# Patient Record
Sex: Female | Born: 1970 | Race: Asian | Hispanic: No | Smoking: Never smoker
Health system: Southern US, Community
[De-identification: ages and names within clinical notes are randomized; demographics above are authoritative.]

## PROBLEM LIST (undated history)

## (undated) DIAGNOSIS — G5600 Carpal tunnel syndrome, unspecified upper limb: Secondary | ICD-10-CM

## (undated) DIAGNOSIS — R921 Mammographic calcification found on diagnostic imaging of breast: Secondary | ICD-10-CM

## (undated) DIAGNOSIS — R7989 Other specified abnormal findings of blood chemistry: Secondary | ICD-10-CM

## (undated) DIAGNOSIS — E781 Pure hyperglyceridemia: Secondary | ICD-10-CM

## (undated) DIAGNOSIS — K219 Gastro-esophageal reflux disease without esophagitis: Secondary | ICD-10-CM

## (undated) DIAGNOSIS — D219 Benign neoplasm of connective and other soft tissue, unspecified: Secondary | ICD-10-CM

## (undated) HISTORY — DX: Carpal tunnel syndrome, unspecified upper limb: G56.00

## (undated) HISTORY — DX: Mammographic calcification found on diagnostic imaging of breast: R92.1

## (undated) HISTORY — DX: Other specified abnormal findings of blood chemistry: R79.89

## (undated) HISTORY — DX: Gastro-esophageal reflux disease without esophagitis: K21.9

## (undated) HISTORY — DX: Pure hyperglyceridemia: E78.1

## (undated) HISTORY — DX: Benign neoplasm of connective and other soft tissue, unspecified: D21.9

---

## 2004-08-07 ENCOUNTER — Ambulatory Visit: Payer: Self-pay | Admitting: Internal Medicine

## 2006-04-23 ENCOUNTER — Ambulatory Visit: Payer: Self-pay | Admitting: Endocrinology

## 2006-04-24 ENCOUNTER — Encounter (INDEPENDENT_AMBULATORY_CARE_PROVIDER_SITE_OTHER): Payer: Self-pay | Admitting: *Deleted

## 2006-04-24 ENCOUNTER — Ambulatory Visit (HOSPITAL_COMMUNITY): Admission: RE | Admit: 2006-04-24 | Discharge: 2006-04-24 | Payer: Self-pay | Admitting: Endocrinology

## 2007-02-12 ENCOUNTER — Ambulatory Visit: Payer: Self-pay | Admitting: Internal Medicine

## 2007-02-12 DIAGNOSIS — F411 Generalized anxiety disorder: Secondary | ICD-10-CM | POA: Insufficient documentation

## 2007-02-12 DIAGNOSIS — M545 Low back pain, unspecified: Secondary | ICD-10-CM | POA: Insufficient documentation

## 2007-02-12 DIAGNOSIS — F329 Major depressive disorder, single episode, unspecified: Secondary | ICD-10-CM | POA: Insufficient documentation

## 2007-02-12 LAB — CONVERTED CEMR LAB: H Pylori IgG: NEGATIVE

## 2007-07-15 ENCOUNTER — Ambulatory Visit: Payer: Self-pay | Admitting: Internal Medicine

## 2007-07-15 LAB — CONVERTED CEMR LAB
ALT: 12 units/L (ref 0–35)
AST: 21 units/L (ref 0–37)
Alkaline Phosphatase: 42 units/L (ref 39–117)
Basophils Relative: 0.6 % (ref 0.0–1.0)
Bilirubin, Direct: 0.1 mg/dL (ref 0.0–0.3)
CO2: 30 meq/L (ref 19–32)
Calcium: 9 mg/dL (ref 8.4–10.5)
Chloride: 101 meq/L (ref 96–112)
Creatinine, Ser: 0.6 mg/dL (ref 0.4–1.2)
Crystals: NEGATIVE
Eosinophils Relative: 0.5 % (ref 0.0–5.0)
Glucose, Bld: 95 mg/dL (ref 70–99)
Leukocytes, UA: NEGATIVE
Lymphocytes Relative: 27.3 % (ref 12.0–46.0)
Neutro Abs: 5.4 10*3/uL (ref 1.4–7.7)
Nitrite: NEGATIVE
Platelets: 309 10*3/uL (ref 150–400)
RDW: 11.4 % — ABNORMAL LOW (ref 11.5–14.6)
Specific Gravity, Urine: 1.025 (ref 1.000–1.03)
Total CHOL/HDL Ratio: 3.2
Total Protein, Urine: NEGATIVE mg/dL
Total Protein: 7.5 g/dL (ref 6.0–8.3)
Urine Glucose: NEGATIVE mg/dL
WBC: 8.3 10*3/uL (ref 4.5–10.5)

## 2007-07-19 ENCOUNTER — Ambulatory Visit: Payer: Self-pay | Admitting: Internal Medicine

## 2007-07-19 DIAGNOSIS — J069 Acute upper respiratory infection, unspecified: Secondary | ICD-10-CM | POA: Insufficient documentation

## 2007-07-19 DIAGNOSIS — K130 Diseases of lips: Secondary | ICD-10-CM | POA: Insufficient documentation

## 2008-07-17 LAB — CONVERTED CEMR LAB: Pap Smear: NORMAL

## 2008-08-04 ENCOUNTER — Ambulatory Visit: Payer: Self-pay | Admitting: Internal Medicine

## 2008-08-04 LAB — CONVERTED CEMR LAB
ALT: 15 units/L (ref 0–35)
AST: 26 units/L (ref 0–37)
Alkaline Phosphatase: 42 units/L (ref 39–117)
Basophils Absolute: 0 10*3/uL (ref 0.0–0.1)
Bilirubin Urine: NEGATIVE
Bilirubin, Direct: 0.1 mg/dL (ref 0.0–0.3)
CO2: 31 meq/L (ref 19–32)
Chloride: 104 meq/L (ref 96–112)
Glucose, Bld: 100 mg/dL — ABNORMAL HIGH (ref 70–99)
Hemoglobin: 15.6 g/dL — ABNORMAL HIGH (ref 12.0–15.0)
LDL Cholesterol: 73 mg/dL (ref 0–99)
Leukocytes, UA: NEGATIVE
Lymphocytes Relative: 30.9 % (ref 12.0–46.0)
MCHC: 34.9 g/dL (ref 30.0–36.0)
Monocytes Relative: 8.8 % (ref 3.0–12.0)
Neutro Abs: 4 10*3/uL (ref 1.4–7.7)
Neutrophils Relative %: 59.2 % (ref 43.0–77.0)
Nitrite: NEGATIVE
Potassium: 4.1 meq/L (ref 3.5–5.1)
RBC: 4.86 M/uL (ref 3.87–5.11)
RDW: 11.4 % — ABNORMAL LOW (ref 11.5–14.6)
Sodium: 138 meq/L (ref 135–145)
Specific Gravity, Urine: 1.01 (ref 1.000–1.03)
Total Bilirubin: 0.6 mg/dL (ref 0.3–1.2)
Total CHOL/HDL Ratio: 3.7
Total Protein: 7.6 g/dL (ref 6.0–8.3)
Urobilinogen, UA: 0.2 (ref 0.0–1.0)
pH: 6 (ref 5.0–8.0)

## 2008-08-11 ENCOUNTER — Ambulatory Visit: Payer: Self-pay | Admitting: Internal Medicine

## 2008-08-11 DIAGNOSIS — R0602 Shortness of breath: Secondary | ICD-10-CM | POA: Insufficient documentation

## 2008-08-11 DIAGNOSIS — J45909 Unspecified asthma, uncomplicated: Secondary | ICD-10-CM | POA: Insufficient documentation

## 2008-08-11 IMAGING — CR DG CHEST 2V
4 series · 4 of 4 positions shown · non-contrast
Comparison: [DATE]

CLINICAL DATA: Dyspnea

CHEST - 2 VIEW

[view not recorded (1 of 4)]
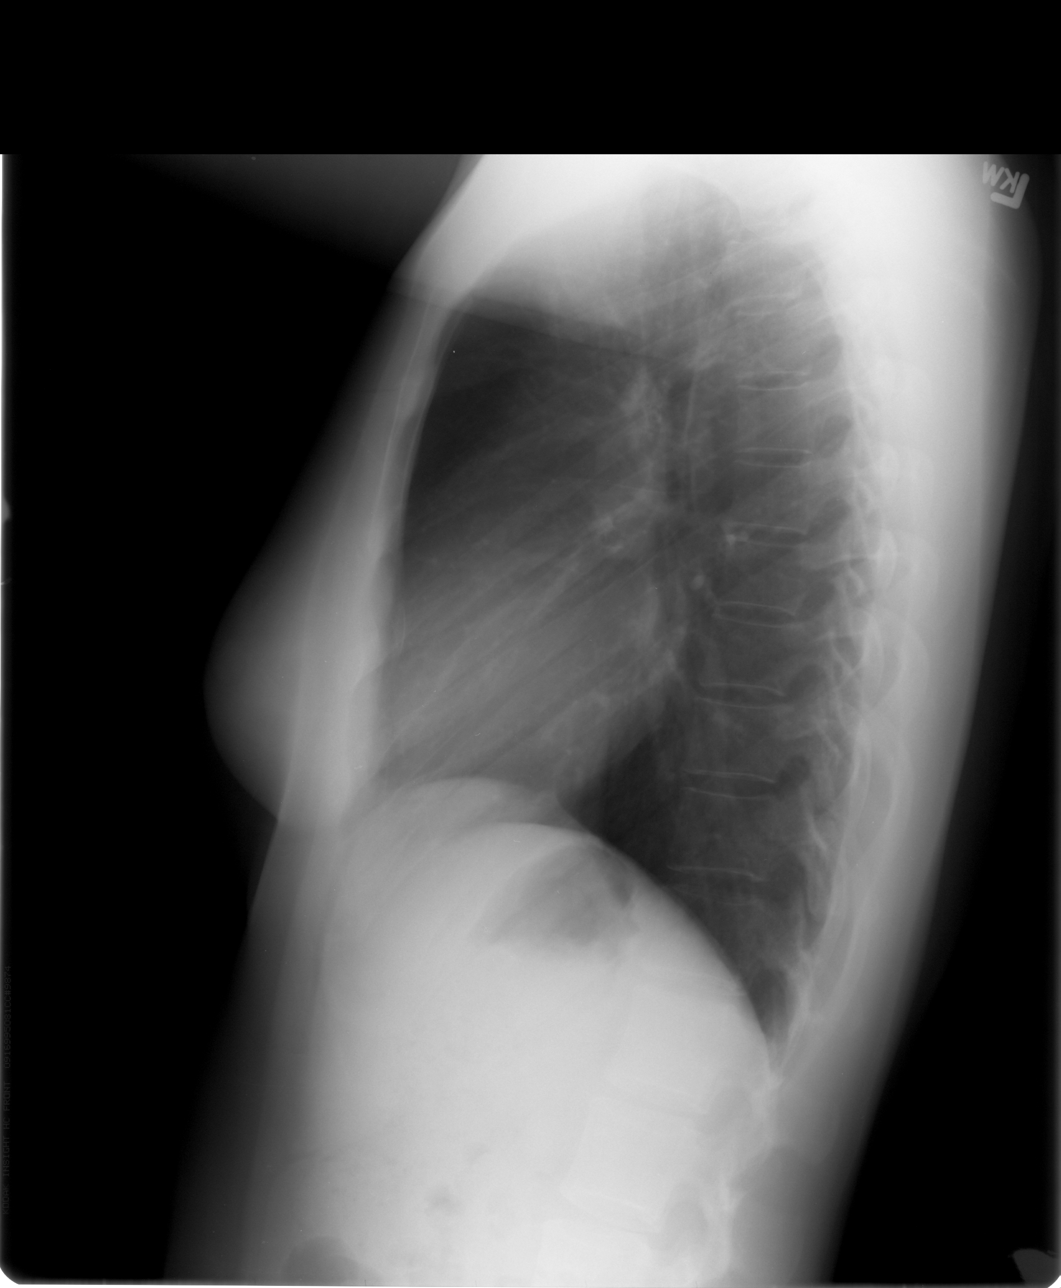

[view not recorded (2 of 4)]
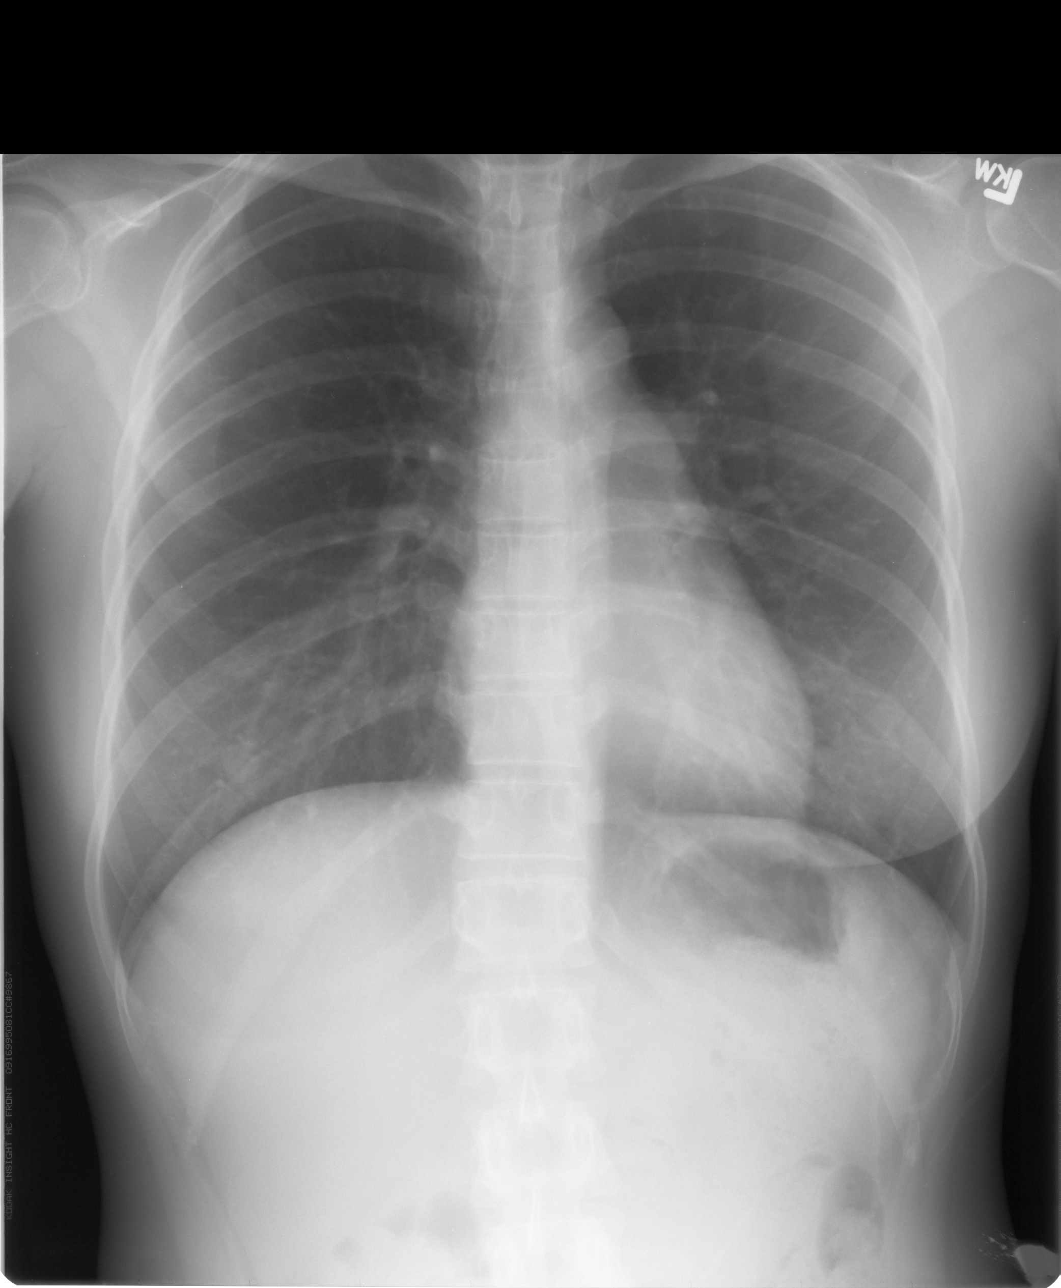

[view not recorded (3 of 4)]
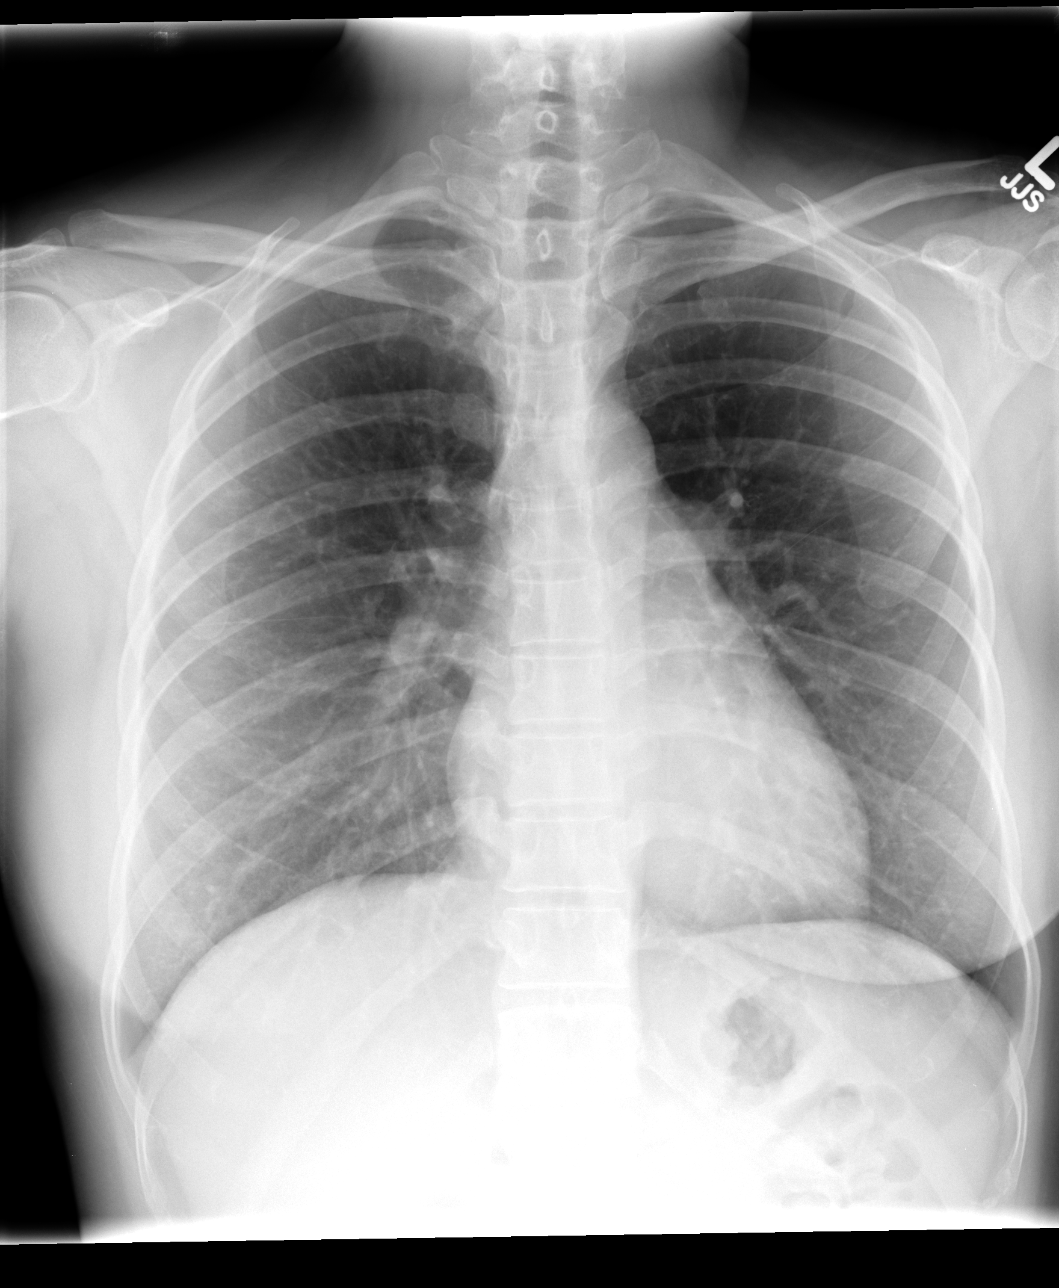

[view not recorded (4 of 4)]
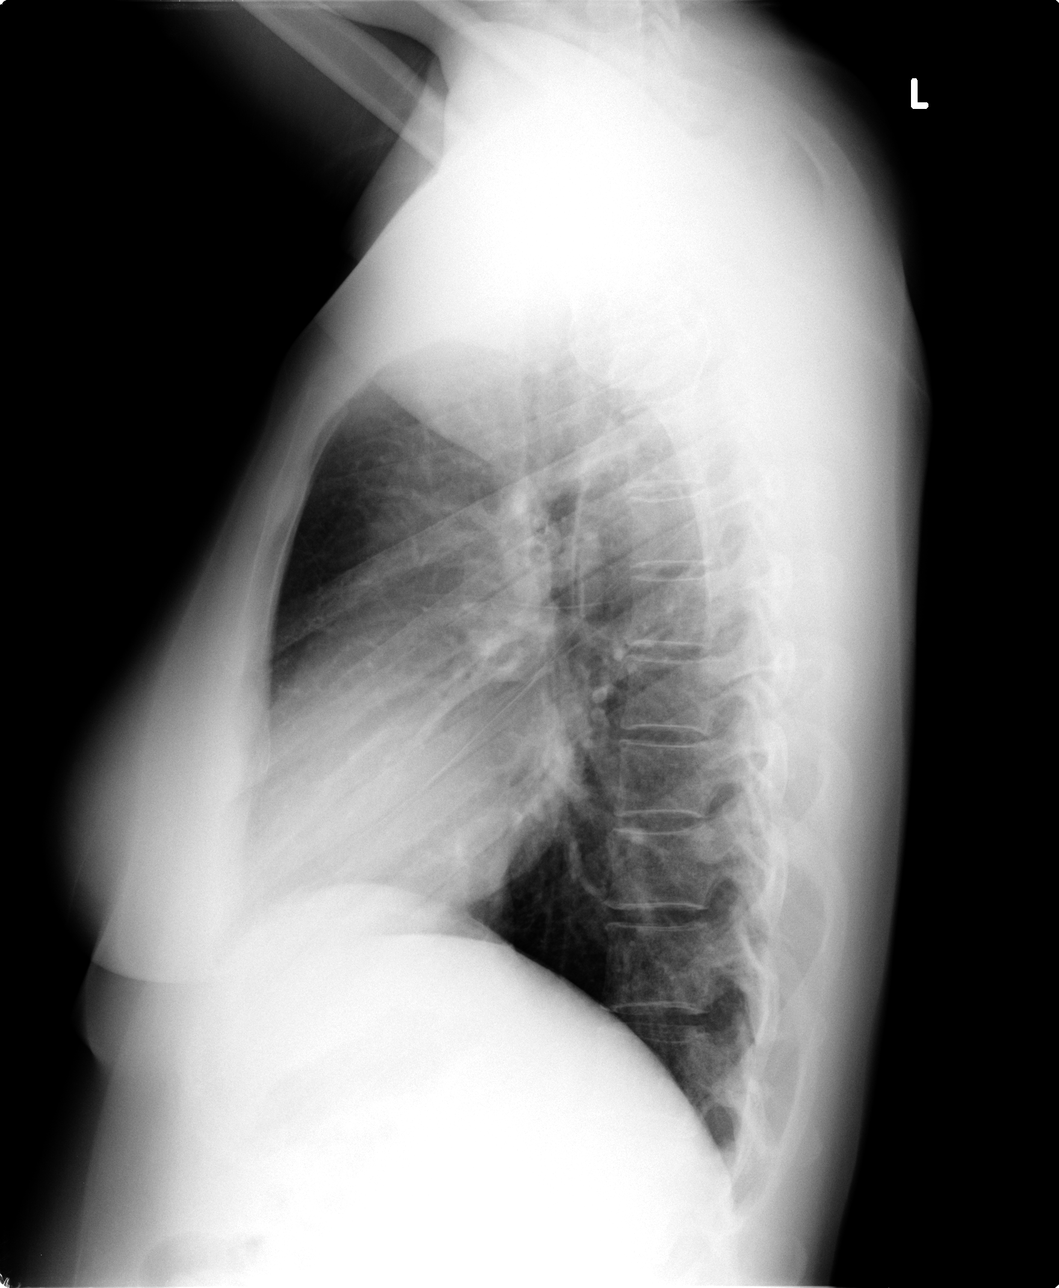

[4 of 4 positions shown; findings below may reference images not displayed]

FINDINGS: The heart size and mediastinal contours are within normal
limits.  Both lungs are clear.  The visualized skeletal structures
are unremarkable.
IMPRESSION: No active cardiopulmonary disease.

## 2009-01-29 ENCOUNTER — Telehealth: Payer: Self-pay | Admitting: Internal Medicine

## 2009-01-29 DIAGNOSIS — K219 Gastro-esophageal reflux disease without esophagitis: Secondary | ICD-10-CM | POA: Insufficient documentation

## 2009-03-06 ENCOUNTER — Ambulatory Visit: Payer: Self-pay | Admitting: *Deleted

## 2009-03-06 DIAGNOSIS — E739 Lactose intolerance, unspecified: Secondary | ICD-10-CM | POA: Insufficient documentation

## 2009-03-20 ENCOUNTER — Encounter: Payer: Self-pay | Admitting: Gastroenterology

## 2009-03-20 ENCOUNTER — Encounter (INDEPENDENT_AMBULATORY_CARE_PROVIDER_SITE_OTHER): Payer: Self-pay

## 2009-03-20 ENCOUNTER — Ambulatory Visit: Payer: Self-pay | Admitting: Gastroenterology

## 2009-03-21 ENCOUNTER — Encounter: Payer: Self-pay | Admitting: Gastroenterology

## 2009-08-09 ENCOUNTER — Ambulatory Visit: Payer: Self-pay | Admitting: Internal Medicine

## 2009-08-09 DIAGNOSIS — M25569 Pain in unspecified knee: Secondary | ICD-10-CM | POA: Insufficient documentation

## 2009-08-09 DIAGNOSIS — J019 Acute sinusitis, unspecified: Secondary | ICD-10-CM | POA: Insufficient documentation

## 2009-08-09 DIAGNOSIS — M549 Dorsalgia, unspecified: Secondary | ICD-10-CM | POA: Insufficient documentation

## 2009-08-09 DIAGNOSIS — G4700 Insomnia, unspecified: Secondary | ICD-10-CM | POA: Insufficient documentation

## 2009-08-22 ENCOUNTER — Telehealth: Payer: Self-pay | Admitting: Internal Medicine

## 2009-08-23 ENCOUNTER — Telehealth: Payer: Self-pay | Admitting: Internal Medicine

## 2010-07-23 NOTE — Progress Notes (Signed)
Summary: ABX?  Phone Note Call from Patient Call back at Home Phone [phone]   Caller: Patient Summary of Call: pt called stating that she has finished her course of ABX but is still sick, c/o HA and ear pain. pt is requesting another course. please advise Initial call taken by: Margaret Pyle, CMA,  August 22, 2009 2:23 PM  Follow-up for Phone Call        pt informed Follow-up by: Margaret Pyle, CMA,  August 22, 2009 2:38 PM    New/Updated Medications: AZITHROMYCIN 250 MG TABS (AZITHROMYCIN) 2po qd for 1 day, then 1po qd for 4days, then stop Prescriptions: AZITHROMYCIN 250 MG TABS (AZITHROMYCIN) 2po qd for 1 day, then 1po qd for 4days, then stop  #6 x 1   Entered and Authorized by:   Corwin Levins MD   Signed by:   Corwin Levins MD on 08/22/2009   Method used:   Electronically to        [facility] [phone]* (retail)       [address]       [place], [place]  [address]       Ph: [phone] or [phone]       Fax: [phone]   RxID:   [id number]  done escript Corwin Levins MD  August 22, 2009 2:34 PM

## 2010-07-23 NOTE — Progress Notes (Signed)
Summary: pt?  Phone Note Call from Patient Call back at Home Phone [phone]   Caller: Patient Summary of Call: pt called stating that she is having chills, night sweats and SOB. pt wants to know if she should continue taking ABX or return for OV? Initial call taken by: Margaret Pyle, CMA,  August 23, 2009 1:04 PM  Follow-up for Phone Call        as per yesterday, ok for change antibx - done escript yest;  can make OV if she likes but I am not sure is necessary unless high fever, increased pain, wheezing or sob Follow-up by: Corwin Levins MD,  August 23, 2009 1:21 PM  Additional Follow-up for Phone Call Additional follow up Details #1::        pt informed and will continue taking ABX Additional Follow-up by: Margaret Pyle, CMA,  August 23, 2009 2:02 PM

## 2010-07-23 NOTE — Assessment & Plan Note (Signed)
Summary: cold,cough     Vital Signs:  Patient profile:   40 year old female Height:      65 inches Weight:      148 pounds BMI:     24.72 O2 Sat:      98 % on Room air Temp:     98.1 degrees F oral Pulse rate:   78 / minute BP sitting:   102 / 70  (left arm) Cuff size:   regular  Vitals Entered ByZella Ball Ewing (August 09, 2009 3:28 PM)  O2 Flow:  Room air CC: cold, headaches/RE   Primary Care Provider:  Oliver Barre ,MD   CC:  cold and headaches/RE.  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  also has had some diffuse upper bilat back discomfort non pleuritic and worse to move the arms and shoulders but again no symptoms as above.  Pain and worry and illness and stress recently causing marked problem with insomnia over the past wk.  denies signficant depressive symtpoms, and no suicidal ideation or panic.    Problems Prior to Update: 1)  Insomnia-sleep Disorder-unspec  (ICD-780.52) 2)  Sinusitis- Acute-nos  (ICD-461.9) 3)  Lactose Intolerance  (ICD-271.3) 4)  Gerd  (ICD-530.81) 5)  Asthma  (ICD-493.90) 6)  Dyspnea  (ICD-786.05) 7)  Preventive Health Care  (ICD-V70.0) 8)  Cheilitis, Angular  (ICD-528.5) 9)  Uri  (ICD-465.9) 10)  Preventive Health Care  (ICD-V70.0) 11)  Routine General Medical Exam@health  Care Facl  (ICD-V70.0) 12)  Low Back Pain  (ICD-724.2) 13)   Depression  (ICD-311) 14)  Anxiety  (ICD-300.00) 15)  Positive Ppd  (ICD-795.5)  Medications Prior to Update: 1)  Proair Hfa 108 (90 Base) Mcg/act Aers (Albuterol Sulfate) .... 2 Puffs Four Times Per Day As Needed For Shortness of Breath 2)  Prenatal Vitamins (Unknown Dosage) .... Take 1 Tablet By Mouth Once A Day 3)  Dexilant 60 Mg Cpdr (Dexlansoprazole) .Marland Kitchen.. 1 Capsule By Mouth Every Morning 4)  Diflucan 100 Mg  Tabs (Fluconazole) .... Take 1 Tablet Once A Day 5)  Pylera 140-125-125 Mg Caps (Bis Subcit-Metronid-Tetracyc) .... 3 By Mouth Qid X 10 Days  Current Medications (verified): 1)  Proair Hfa 108 (90 Base) Mcg/act Aers (Albuterol Sulfate) .... 2 Puffs Four Times Per Day As Needed For Shortness of Breath 2)  Prenatal Vitamins (Unknown Dosage) .... Take 1 Tablet By Mouth Once A Day 3)  Dexilant 60 Mg Cpdr (Dexlansoprazole) .Marland Kitchen.. 1 Capsule By Mouth Every Morning 4)  Diflucan 100 Mg  Tabs (Fluconazole) .... Take 1 Tablet Once A Day 5)  Pylera 140-125-125 Mg Caps (Bis Subcit-Metronid-Tetracyc) .... 3 By Mouth Qid X 10 Days 6)  Cephalexin 500 Mg Caps (Cephalexin) .Marland Kitchen.. 1 By Mouth Three Times A Day 7)  Zolpidem Tartrate 10 Mg Tabs (Zolpidem Tartrate) .Marland Kitchen.. 1po At Bedtime As Needed  Allergies (verified): 1)  ! 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   Head:  normocephalic and atraumatic.   Eyes:  vision grossly intact, pupils equal, and pupils round.   Ears:  bilat  tm's mild red, sinus tedner bilat left > right Nose:  nasal dischargemucosal pallor and mucosal edema.   Mouth:  pharyngeal erythema and fair dentition.   Neck:  supple and cervical lymphadenopathy.   Lungs:  normal respiratory effort and normal breath sounds.   Heart:  normal rate and regular rhythm.   Abdomen:  soft, non-tender, and normal bowel sounds.   Msk:  has some mild left lateral lumbar tender but minor spasm,  left knee FROM without tender and no swelling;  no upper back tender, no spine tender Extremities:  no edema, no erythema  Neurologic:  cranial nerves II-XII intact, strength normal in all extremities, and sensation intact to light touch.   Psych:  not depressed appearing and moderately anxious.     Impression & Recommendations:  Problem # 1:  SINUSITIS- ACUTE-NOS (ICD-461.9)  Her updated medication list for this problem includes:    Cephalexin 500 Mg Caps (Cephalexin) .Marland Kitchen... 1 by mouth three times a day treat as above, f/u any worsening signs or symptoms   Problem # 2:  INSOMNIA-SLEEP DISORDER-UNSPEC (ICD-780.52)  Her updated medication list for this problem includes:    Zolpidem Tartrate 10 Mg Tabs (Zolpidem tartrate) .Marland Kitchen... 1po at bedtime as needed treat as above, f/u any worsening signs or symptoms   Problem # 3:  BACK PAIN (ICD-724.5) c/w msk strain, for tylenol as needed   Problem # 4:  KNEE PAIN, LEFT (ICD-719.46) bening exam, reassured, ok to follow, for tylenol as needed   Complete Medication List: 1)  Proair Hfa 108 (90 Base) Mcg/act Aers (Albuterol sulfate) .... 2 puffs four times per day as needed for shortness of breath 2)  Prenatal Vitamins (unknown Dosage)  .... Take 1 tablet by mouth once a day 3)  Dexilant 60 Mg Cpdr (Dexlansoprazole) .Marland Kitchen.. 1 capsule by mouth every morning 4)  Diflucan 100 Mg Tabs (Fluconazole) .... Take 1 tablet once a day 5)  Pylera 140-125-125 Mg Caps (Bis subcit-metronid-tetracyc) .... 3 by mouth qid x 10 days 6)  Cephalexin  500 Mg Caps (Cephalexin) .Marland Kitchen.. 1 by mouth three times a day 7)  Zolpidem Tartrate 10 Mg Tabs (Zolpidem tartrate) .Marland Kitchen.. 1po at bedtime as needed  Patient Instructions: 1)  Please take all new medications as prescribed  - the antibiotic, and the sleep medicine 2)  Continue all previous medications as before this visit  3)  Please schedule a follow-up appointment as needed. Prescriptions: ZOLPIDEM TARTRATE 10 MG TABS (ZOLPIDEM TARTRATE) 1po at bedtime as needed  #30 x 2   Entered and Authorized by:   Corwin Levins MD   Signed by:   Corwin Levins MD on 08/09/2009   Method used:   Print then Give to Patient   RxID:  [id number] CEPHALEXIN 500 MG CAPS (CEPHALEXIN) 1 by mouth three times a day  #30 x 0   Entered and Authorized by:   Corwin Levins MD   Signed by:   Corwin Levins MD on 08/09/2009   Method used:   Print then Give to Patient   RxID:   [id number]

## 2010-09-27 ENCOUNTER — Other Ambulatory Visit (HOSPITAL_COMMUNITY): Payer: Self-pay | Admitting: Internal Medicine

## 2010-09-27 DIAGNOSIS — Z1231 Encounter for screening mammogram for malignant neoplasm of breast: Secondary | ICD-10-CM

## 2010-10-04 ENCOUNTER — Ambulatory Visit (HOSPITAL_COMMUNITY)
Admission: RE | Admit: 2010-10-04 | Discharge: 2010-10-04 | Disposition: A | Discharge status: Home / Self Care | Payer: Self-pay | Source: Ambulatory Visit | Attending: Internal Medicine | Admitting: Internal Medicine

## 2010-10-04 DIAGNOSIS — Z1231 Encounter for screening mammogram for malignant neoplasm of breast: Secondary | ICD-10-CM

## 2011-10-29 ENCOUNTER — Other Ambulatory Visit (HOSPITAL_COMMUNITY): Payer: Self-pay | Admitting: Internal Medicine

## 2011-10-29 DIAGNOSIS — Z1231 Encounter for screening mammogram for malignant neoplasm of breast: Secondary | ICD-10-CM

## 2011-11-25 ENCOUNTER — Ambulatory Visit (HOSPITAL_COMMUNITY)
Admission: RE | Admit: 2011-11-25 | Discharge: 2011-11-25 | Disposition: A | Discharge status: Home / Self Care | Payer: Self-pay | Source: Ambulatory Visit | Attending: Internal Medicine | Admitting: Internal Medicine

## 2011-11-25 DIAGNOSIS — Z1231 Encounter for screening mammogram for malignant neoplasm of breast: Secondary | ICD-10-CM

## 2012-11-12 ENCOUNTER — Other Ambulatory Visit (HOSPITAL_COMMUNITY): Payer: Self-pay | Admitting: Internal Medicine

## 2012-11-12 DIAGNOSIS — Z1231 Encounter for screening mammogram for malignant neoplasm of breast: Secondary | ICD-10-CM

## 2012-11-29 ENCOUNTER — Ambulatory Visit (HOSPITAL_COMMUNITY): Payer: Self-pay

## 2012-11-30 ENCOUNTER — Ambulatory Visit (INDEPENDENT_AMBULATORY_CARE_PROVIDER_SITE_OTHER): Payer: BC Managed Care – PPO | Admitting: Physician Assistant

## 2012-11-30 VITALS — BP 102/56 | HR 83 | Temp 98.1°F | Resp 14 | Ht 66.0 in | Wt 146.0 lb

## 2012-11-30 DIAGNOSIS — Z23 Encounter for immunization: Secondary | ICD-10-CM

## 2012-11-30 DIAGNOSIS — K219 Gastro-esophageal reflux disease without esophagitis: Secondary | ICD-10-CM

## 2012-11-30 DIAGNOSIS — Z0000 Encounter for general adult medical examination without abnormal findings: Secondary | ICD-10-CM

## 2012-11-30 LAB — COMPREHENSIVE METABOLIC PANEL
AST: 18 U/L (ref 0–37)
Alkaline Phosphatase: 46 U/L (ref 39–117)
BUN: 11 mg/dL (ref 6–23)
Glucose, Bld: 91 mg/dL (ref 70–99)
Sodium: 138 mEq/L (ref 135–145)
Total Bilirubin: 0.6 mg/dL (ref 0.3–1.2)

## 2012-11-30 LAB — LIPID PANEL
HDL: 43 mg/dL (ref >39)
LDL Cholesterol: 69 mg/dL (ref 0–99)
Triglycerides: 104 mg/dL (ref <150)
VLDL: 21 mg/dL (ref 0–40)

## 2012-11-30 LAB — POCT URINALYSIS DIPSTICK
Nitrite, UA: NEGATIVE
Protein, UA: NEGATIVE
Spec Grav, UA: 1.015
Urobilinogen, UA: 0.2

## 2012-11-30 LAB — POCT CBC
Granulocyte percent: 73.2 %G (ref 37–80)
MCV: 97.2 fL — AB (ref 80–97)
MID (cbc): 0.7 (ref 0–0.9)
MPV: 8 fL (ref 0–99.8)
POC LYMPH PERCENT: 20.9 %L (ref 10–50)
POC MID %: 5.9 %M (ref 0–12)
Platelet Count, POC: 284 10*3/uL (ref 142–424)
RBC: 4.57 M/uL (ref 4.04–5.48)
RDW, POC: 12.1 %

## 2012-11-30 LAB — TSH: TSH: 0.627 u[IU]/mL (ref 0.350–4.500)

## 2012-11-30 LAB — LIPASE: Lipase: 22 U/L (ref 0–75)

## 2012-11-30 MED ORDER — TETANUS-DIPHTH-ACELL PERTUSSIS 5-2.5-18.5 LF-MCG/0.5 IM SUSP
0.5000 mL | Freq: Once | INTRAMUSCULAR | Status: AC
  Administered 2012-11-30: 0.5 mL via INTRAMUSCULAR

## 2012-11-30 NOTE — Addendum Note (Signed)
Addended by: Cydney Ok on: 11/30/2012 01:13 PM   Modules accepted: Orders

## 2012-11-30 NOTE — Progress Notes (Signed)
[address] [place] [address]   Phone [phone]  Subjective:    Patient ID: Karen Thornton, female    DOB: 04-Jun-1971, 42 y.o.   MRN: [id number]  HPI  Pt presents to clinic for CPE.  She is doing well except she is starting to have pain from reflux again.  In 2010 she was diagnosed with h pylori from endoscopy and she was treated. She is currently sexually active but they are not using birth control - she is not sure if she wants to be pregnant but they are not against the idea.  She started Prevacid yesterday for her stomach pain - she remembers that she ate from extra hot peppers the other day for dinner and wonders if that started her pain  Review of Systems  Constitutional: Negative.   HENT: Negative.   Eyes: Negative.   Gastrointestinal: Positive for abdominal pain (epigastric ).  Endocrine: Negative.   Genitourinary: Negative.   Musculoskeletal: Negative.   Skin: Negative.   Allergic/Immunologic: Negative.   Neurological: Negative.   Hematological: Negative.   Psychiatric/Behavioral: Negative.        Objective:   Physical Exam  Vitals reviewed. Constitutional: She is oriented to person, place, and time. She appears well-developed and well-nourished.  HENT:  Head: Normocephalic and atraumatic.  Right Ear: Hearing, tympanic membrane, external ear and ear canal normal.  Left Ear: Hearing, tympanic membrane, external ear and ear canal normal.  Nose: Nose normal.  Mouth/Throat: Uvula is midline, oropharynx is clear and moist and mucous membranes are normal.  Eyes: Conjunctivae and EOM are normal. Pupils are equal, round, and reactive to light.  Neck: Normal range of motion. Neck supple. No thyromegaly present.  Cardiovascular: Normal rate, regular rhythm and normal heart sounds.   No murmur heard. Pulmonary/Chest: Effort normal and breath sounds normal.  Abdominal: Soft. Bowel sounds are normal. There is no tenderness.  Genitourinary: Vagina normal and uterus normal. No  breast swelling, tenderness, discharge or bleeding. Pelvic exam was performed with patient supine. No labial fusion. There is no rash, tenderness, lesion or injury on the right labia. There is no rash, tenderness, lesion or injury on the left labia. Cervix exhibits no motion tenderness, no discharge and no friability. Right adnexum displays no mass, no tenderness and no fullness. Left adnexum displays no mass, no tenderness and no fullness.  Musculoskeletal: Normal range of motion.  Neurological: She is alert and oriented to person, place, and time. She has normal reflexes.  Skin: Skin is warm and dry.  Psychiatric: She has a normal mood and affect. Her behavior is normal. Judgment and thought content normal.    Results for orders placed in visit on 11/30/12  POCT CBC      Result Value Range   WBC 11.4 (*) 4.6 - 10.2 K/uL   Lymph, poc 2.4  0.6 - 3.4   POC LYMPH PERCENT 20.9  10 - 50 %L   MID (cbc) 0.7  0 - 0.9   POC MID % 5.9  0 - 12 %M   POC Granulocyte 8.3 (*) 2 - 6.9   Granulocyte percent 73.2  37 - 80 %G   RBC 4.57  4.04 - 5.48 M/uL   Hemoglobin 14.1  12.2 - 16.2 g/dL   HCT, POC 78.4  69.6 - 47.9 %   MCV 97.2 (*) 80 - 97 fL   MCH, POC 30.9  27 - 31.2 pg   MCHC 31.8  31.8 - 35.4 g/dL  RDW, POC 12.1     Platelet Count, POC 284  142 - 424 K/uL   MPV 8.0  0 - 99.8 fL  POCT URINALYSIS DIPSTICK      Result Value Range   Color, UA yellow     Clarity, UA clear     Glucose, UA neg     Bilirubin, UA neg     Ketones, UA neg     Spec Grav, UA 1.015     Blood, UA small     pH, UA 7.0     Protein, UA neg     Urobilinogen, UA 0.2     Nitrite, UA neg     Leukocytes, UA Negative           Assessment & Plan:  Annual physical exam - Plan: POCT CBC, POCT urinalysis dipstick, Comprehensive metabolic panel, Lipid panel, TSH, Pap IG and HPV (high risk) DNA detection  GERD (gastroesophageal reflux disease) -Pt to continue Prevacid 30mg  and to call me for a refill if she is better - with  patients history of H pylori I did not do the IgG but if she is not better I will consider retreatment -  Plan: Lipase - pt to monitor her spicy greasy food intake.  Benny Lennert PA-C 11/30/2012 9:40 AM

## 2012-11-30 NOTE — Patient Instructions (Addendum)
Prevacid 15mg  (the ones you have at home) - take 2 pills a day - if you are better let me know and I will send you in a Rx - if you are not better come back to office  Lotion for dry skin - curel or eucerin - sensitive skin  We will send you a letter about your labs when they are back

## 2012-12-02 LAB — PAP IG AND HPV HIGH-RISK

## 2012-12-28 ENCOUNTER — Ambulatory Visit (HOSPITAL_COMMUNITY): Payer: Self-pay

## 2012-12-29 ENCOUNTER — Ambulatory Visit (HOSPITAL_COMMUNITY)
Admission: RE | Admit: 2012-12-29 | Discharge: 2012-12-29 | Disposition: A | Discharge status: Home / Self Care | Payer: BC Managed Care – PPO | Source: Ambulatory Visit | Attending: Internal Medicine | Admitting: Internal Medicine

## 2012-12-29 DIAGNOSIS — Z1231 Encounter for screening mammogram for malignant neoplasm of breast: Secondary | ICD-10-CM

## 2013-01-03 ENCOUNTER — Other Ambulatory Visit: Payer: Self-pay | Admitting: Internal Medicine

## 2013-01-03 ENCOUNTER — Other Ambulatory Visit: Payer: Self-pay | Admitting: Physician Assistant

## 2013-01-03 DIAGNOSIS — R928 Other abnormal and inconclusive findings on diagnostic imaging of breast: Secondary | ICD-10-CM

## 2013-01-07 ENCOUNTER — Encounter: Payer: Self-pay | Admitting: Family Medicine

## 2013-01-12 ENCOUNTER — Ambulatory Visit
Admission: RE | Admit: 2013-01-12 | Discharge: 2013-01-12 | Disposition: A | Discharge status: Home / Self Care | Payer: BC Managed Care – PPO | Source: Ambulatory Visit | Attending: Internal Medicine | Admitting: Internal Medicine

## 2013-01-12 ENCOUNTER — Other Ambulatory Visit: Payer: Self-pay | Admitting: Physician Assistant

## 2013-01-12 DIAGNOSIS — R928 Other abnormal and inconclusive findings on diagnostic imaging of breast: Secondary | ICD-10-CM

## 2013-01-20 ENCOUNTER — Ambulatory Visit (INDEPENDENT_AMBULATORY_CARE_PROVIDER_SITE_OTHER): Payer: BC Managed Care – PPO | Admitting: Physician Assistant

## 2013-01-20 VITALS — BP 126/78 | HR 75 | Temp 97.9°F | Resp 16 | Ht 65.0 in | Wt 150.0 lb

## 2013-01-20 DIAGNOSIS — R920 Mammographic microcalcification found on diagnostic imaging of breast: Secondary | ICD-10-CM

## 2013-01-20 NOTE — Progress Notes (Signed)
  Subjective:    Patient ID: Karen Thornton, female    DOB: [birth year], 42 y.o.   MRN: [id number]  HPI This 42 y.o. female presents to discuss recent mammogram, which revealed a cluster of microcalcifications in the upper outer quadrant of the RIGHT breast without associated mass.  She's scheduled for an US-guided biopsy 01/31/2013, but believes that she needs to have another diagnostic mammogram in the interim, since she was wearing powder and lotion at her last one, and worries that the microcalcifications are actually from that.  There is a language barrier.  Review of Systems No palpable breast lump.    Objective:   Physical Exam  BP 126/78  Pulse 75  Temp(Src) 97.9 F (36.6 C) (Oral)  Resp 16  Ht 5\' 5"  (1.651 m)  Wt 150 lb (68.04 kg)  BMI 24.96 kg/m2  SpO2 100%  LMP 01/07/2013 WDWN female, A&O x 3.      Assessment & Plan:  Abnormal mammogram with microcalcification Reviewed her mammogram reports in detail.  Encouraged her to proceed with US-guided biopsy, as it is unlikely that wearing powder/lotion/deodorant would result in the same lesion on 2 separate mammograms.  She is in agreement.

## 2013-01-20 NOTE — Patient Instructions (Signed)
Please proceed with the ultrasound and biopsy, as scheduled for 01/31/2013.

## 2013-01-21 DIAGNOSIS — R921 Mammographic calcification found on diagnostic imaging of breast: Secondary | ICD-10-CM

## 2013-01-21 HISTORY — DX: Mammographic calcification found on diagnostic imaging of breast: R92.1

## 2013-01-21 HISTORY — PX: BREAST BIOPSY: SHX20

## 2013-01-30 ENCOUNTER — Ambulatory Visit (INDEPENDENT_AMBULATORY_CARE_PROVIDER_SITE_OTHER): Payer: BC Managed Care – PPO | Admitting: Family Medicine

## 2013-01-30 VITALS — BP 118/72 | HR 87 | Temp 98.0°F | Resp 17 | Ht 65.5 in | Wt 145.0 lb

## 2013-01-30 DIAGNOSIS — F411 Generalized anxiety disorder: Secondary | ICD-10-CM

## 2013-01-30 DIAGNOSIS — R928 Other abnormal and inconclusive findings on diagnostic imaging of breast: Secondary | ICD-10-CM

## 2013-01-30 DIAGNOSIS — F419 Anxiety disorder, unspecified: Secondary | ICD-10-CM

## 2013-01-30 MED ORDER — LORAZEPAM 0.5 MG PO TABS: 0.5000 mg | ORAL_TABLET | Freq: Every evening | ORAL | Status: DC | PRN

## 2013-01-30 NOTE — Progress Notes (Signed)
42 yo woman with abnormal mammogram, now scheduled for biopsy in 9 days.  LMP 14 July.  She has felt tender swollen breast this last week.  Left is more tender and she thinks there is a lump.  Objective:   DIGITAL DIAGNOSTIC BILATERAL MAMMOGRAM WITH CAD  Comparison: 12/29/2012  Findings:  ACR Breast Density Category d: The breast tissue is extremely  dense, which lowers the sensitivity of mammography.  Magnification views of the upper outer quadrant of the right breast  demonstrate a new 6 mm cluster of heterogeneous calcifications. No  associated mass.  Focal spot compression view of the upper left breast in the MLO  projection and 90 degrees lateral view of the left breast show no  persistent asymmetry or mass.  Mammographic images were processed with CAD.  IMPRESSION:  1. 6 mm cluster of heterogeneous calcifications in the right  breast. Malignancy cannot be excluded. Stereotactic biopsy is  recommended.  2. No evidence of malignancy in the left breast.  RECOMMENDATION:  Stereotactic biopsy of right breast calcifications. The procedure  was discussed in detail with the patient and her questions were  answered. Biopsy has been scheduled for January 31, 2013.  I have discussed the findings and recommendations with the patient.  Results were also provided in writing at the conclusion of the  visit. If applicable, a reminder letter will be sent to the  patient regarding her next appointment.  BI-RADS CATEGORY 4: Suspicious abnormality - biopsy should be  considered.   Breast exam:  No suspicious nodules either side.  Some dimpling left areola.     Assessment:  Abnormal mammogram with suspicious area right breast.  Plan:  Proceed with biopsy Anxiety - Plan: LORazepam (ATIVAN) 0.5 MG tablet   Signed, Elvina Sidle, MD

## 2013-01-30 NOTE — Patient Instructions (Addendum)
DIGITAL DIAGNOSTIC BILATERAL MAMMOGRAM WITH CAD  Comparison: 12/29/2012  Findings:  ACR Breast Density Category d: The breast tissue is extremely  dense, which lowers the sensitivity of mammography.  Magnification views of the upper outer quadrant of the right breast  demonstrate a new 6 mm cluster of heterogeneous calcifications. No  associated mass.  Focal spot compression view of the upper left breast in the MLO  projection and 90 degrees lateral view of the left breast show no  persistent asymmetry or mass.  Mammographic images were processed with CAD.  IMPRESSION:  1. 6 mm cluster of heterogeneous calcifications in the right  breast. Malignancy cannot be excluded. Stereotactic biopsy is  recommended.  2. No evidence of malignancy in the left breast.  RECOMMENDATION:  Stereotactic biopsy of right breast calcifications. The procedure  was discussed in detail with the patient and her questions were  answered. Biopsy has been scheduled for January 31, 2013.  I have discussed the findings and recommendations with the patient.  Results were also provided in writing at the conclusion of the  visit. If applicable, a reminder letter will be sent to the  patient regarding her next appointment.  BI-RADS CATEGORY 4: Suspicious abnormality - biopsy should be  considered.

## 2013-02-08 ENCOUNTER — Other Ambulatory Visit: Payer: Self-pay | Admitting: Physician Assistant

## 2013-02-08 ENCOUNTER — Ambulatory Visit
Admission: RE | Admit: 2013-02-08 | Discharge: 2013-02-08 | Disposition: A | Discharge status: Home / Self Care | Payer: BC Managed Care – PPO | Source: Ambulatory Visit | Attending: Physician Assistant | Admitting: Physician Assistant

## 2013-02-08 DIAGNOSIS — N6320 Unspecified lump in the left breast, unspecified quadrant: Secondary | ICD-10-CM

## 2013-02-08 DIAGNOSIS — R928 Other abnormal and inconclusive findings on diagnostic imaging of breast: Secondary | ICD-10-CM

## 2013-11-11 ENCOUNTER — Ambulatory Visit (INDEPENDENT_AMBULATORY_CARE_PROVIDER_SITE_OTHER): Payer: BC Managed Care – PPO | Admitting: Gynecology

## 2013-11-11 ENCOUNTER — Encounter: Payer: Self-pay | Admitting: Gynecology

## 2013-11-11 VITALS — BP 108/70 | HR 64 | Resp 18 | Ht 65.0 in | Wt 138.0 lb

## 2013-11-11 DIAGNOSIS — N920 Excessive and frequent menstruation with regular cycle: Secondary | ICD-10-CM

## 2013-11-11 DIAGNOSIS — N852 Hypertrophy of uterus: Secondary | ICD-10-CM

## 2013-11-11 DIAGNOSIS — Z0000 Encounter for general adult medical examination without abnormal findings: Secondary | ICD-10-CM

## 2013-11-11 DIAGNOSIS — Z01419 Encounter for gynecological examination (general) (routine) without abnormal findings: Secondary | ICD-10-CM

## 2013-11-11 LAB — POCT URINALYSIS DIPSTICK
BILIRUBIN UA: NEGATIVE
Glucose, UA: NEGATIVE
KETONES UA: NEGATIVE
Leukocytes, UA: NEGATIVE
Nitrite, UA: NEGATIVE
PH UA: 6
PROTEIN UA: NEGATIVE
Urobilinogen, UA: NEGATIVE

## 2013-11-11 LAB — HEMOGLOBIN, FINGERSTICK: Hemoglobin, fingerstick: 14.4 g/dL (ref 12.0–16.0)

## 2013-11-11 NOTE — Progress Notes (Signed)
43 y.o. Single Asian female   [id number] here for annual exam. Pt is currently sexually active.  Pt reports cycles are regular but are getting heavier with clots but only changes pad 3x/d.  Clots are variable, can be approx 5cm.  No contraception but not interested in conception.  Stopped ocp due to melasma but skin did not return back to normal.  Patient's last menstrual period was 10/28/2013.          Sexually active: yes  The current method of family planning is none.    Exercising: yes  Runing Last pap: 11/2012 - Neg. HR HPV Neg Alcohol: yes, occ Tobacco:No BSE: Yes, 2 x weekly MMG: 12/2012 BI-RADS 0. 01/2013 BI-RADS 2: Benign.     Health Maintenance  Topic Date Due  . Influenza Vaccine  01/21/2014  . Pap Smear  12/01/2015  . Tetanus/tdap  12/01/2022    Family History  Problem Relation Age of Onset  . Stroke Mother   . Diabetes Mother   . Cancer Father 28    liver  . Diabetes Brother     Patient Active Problem List   Diagnosis Date Noted  . Abnormal mammogram with microcalcification 01/20/2013  . GERD 01/29/2009  . POSITIVE PPD 02/12/2007    Past Medical History  Diagnosis Date  . Calcification of right breast 02/2013    Benign     Past Surgical History  Procedure Laterality Date  . Breast biopsy Right 02/2013    Allergies: Asa and Ibuprofen  Current Outpatient Prescriptions  Medication Sig Dispense Refill  . Multiple Vitamins-Minerals (HAIR/SKIN/NAILS) TABS Take by mouth daily.       No current facility-administered medications for this visit.    ROS: Pertinent items are noted in HPI.  Exam:    BP 108/70  Pulse 64  Resp 18  Ht 5\' 5"  (1.651 m)  Wt 138 lb (62.596 kg)  BMI 22.96 kg/m2  LMP 10/28/2013 Weight change: @WEIGHTCHANGE @ Last 3 height recordings:  Ht Readings from Last 3 Encounters:  11/11/13 5\' 5"  (1.651 m)  01/30/13 5' 5.5" (1.664 m)  01/20/13 5\' 5"  (1.651 m)   General appearance: alert, cooperative and appears stated age Head:  Normocephalic, without obvious abnormality, atraumatic Neck: no adenopathy, no carotid bruit, no JVD, supple, symmetrical, trachea midline and thyroid not enlarged, symmetric, no tenderness/mass/nodules Lungs: clear to auscultation bilaterally Breasts: normal appearance, no masses or tenderness Heart: regular rate and rhythm, S1, S2 normal, no murmur, click, rub or gallop Abdomen: soft, non-tender; bowel sounds normal; no masses,  no organomegaly Extremities: extremities normal, atraumatic, no cyanosis or edema Skin: Skin color, texture, turgor normal. No rashes or lesions Lymph nodes: Cervical, supraclavicular, and axillary nodes normal. no inguinal nodes palpated Neurologic: Grossly normal   Pelvic: External genitalia:  no lesions              Urethra: normal appearing urethra with no masses, tenderness or lesions              Bartholins and Skenes: normal                 Vagina: normal appearing vagina with normal color and discharge, no lesions              Cervix: normal appearance              Pap taken: no        Bimanual Exam:  Uterus:  enlarged to 10 week's size, irregular  Adnexa:    no masses                                      Rectovaginal: Confirms                                      Anus:  normal sphincter tone, no lesions 1. Routine gynecological examination counseled on breast self exam, mammography screening,  adequate intake of calcium and vitamin D, diet and exercise  2. Laboratory examination ordered as part of a routine general medical examination  - POCT Urinalysis Dipstick - Hemoglobin, fingerstick  3. Menorrhagia Irregular uterus-fibroids, will get PUS/SHG to evaluate location of fibroids and determine treatment.  Pt agreeable - US Sonohysterogram; Future  return annually or prn   An After Visit Summary was printed and given to the patient.

## 2013-11-15 ENCOUNTER — Telehealth: Payer: Self-pay | Admitting: Gynecology

## 2013-11-15 NOTE — Telephone Encounter (Signed)
Left message for patient to call back. Need to go over benefits and schedule [facility]. °

## 2013-11-16 NOTE — Telephone Encounter (Signed)
Spoke with patient. Advised that per benefit quote received, she will be responsible for $40 copay for [facility]. Patient agreeable. Scheduled SH[facility]. Advised patient of 72 hour cancellation policy and $100 cancellation fee. Patient agreeable.  Mailed the In-Office procedure form that includes appointment date and time, patient copay, and cancellation policy.

## 2013-11-22 ENCOUNTER — Other Ambulatory Visit: Payer: Self-pay | Admitting: Gynecology

## 2013-11-22 ENCOUNTER — Ambulatory Visit (INDEPENDENT_AMBULATORY_CARE_PROVIDER_SITE_OTHER): Payer: BC Managed Care – PPO | Admitting: Gynecology

## 2013-11-22 ENCOUNTER — Encounter: Payer: Self-pay | Admitting: Gynecology

## 2013-11-22 ENCOUNTER — Ambulatory Visit (INDEPENDENT_AMBULATORY_CARE_PROVIDER_SITE_OTHER): Payer: BC Managed Care – PPO

## 2013-11-22 VITALS — BP 94/60 | Resp 16 | Ht 65.0 in | Wt 149.0 lb

## 2013-11-22 DIAGNOSIS — N920 Excessive and frequent menstruation with regular cycle: Secondary | ICD-10-CM

## 2013-11-22 DIAGNOSIS — N852 Hypertrophy of uterus: Secondary | ICD-10-CM

## 2013-11-22 NOTE — Progress Notes (Signed)
     Pt here for PUS/SHG to evaluate menorrhagia and enlarged uterus. Pt is day 26 of cycle and is not using contraception, SHG cancelled  Pt is not interested in conception but boyfriend is. Images reviewed with pt, uterus globally enlarged, suggestive of adenomyosis.  9.6x7.1x6.3cm, no fibroids noted, corpus luteum on left, right ovary normal, no free fluid Discussed options to treat menorrhagia and adenomyosis reviewed. Discussed risks of advanced age for pregnancy-she would be 44yo-risks of diabetes, HTN, operative delivery and PPH reviewed as well as aneupoloidy. Pt will consider having Mirena IUD placed, reviewed risks and benefits, information given. 79m spent counseling, >50% face to face

## 2013-11-22 NOTE — Patient Instructions (Signed)
Levonorgestrel intrauterine device (IUD) What is this medicine? LEVONORGESTREL IUD (LEE voe nor jes trel) is a contraceptive (birth control) device. The device is placed inside the uterus by a healthcare professional. It is used to prevent pregnancy and can also be used to treat heavy bleeding that occurs during your period. Depending on the device, it can be used for 3 to 5 years. This medicine may be used for other purposes; ask your health care provider or pharmacist if you have questions. COMMON BRAND NAME(S): Mirena, Skyla What should I tell my health care provider before I take this medicine? They need to know if you have any of these conditions: -abnormal Pap smear -cancer of the breast, uterus, or cervix -diabetes -endometritis -genital or pelvic infection now or in the past -have more than one sexual partner or your partner has more than one partner -heart disease -history of an ectopic or tubal pregnancy -immune system problems -IUD in place -liver disease or tumor -problems with blood clots or take blood-thinners -use intravenous drugs -uterus of unusual shape -vaginal bleeding that has not been explained -an unusual or allergic reaction to levonorgestrel, other hormones, silicone, or polyethylene, medicines, foods, dyes, or preservatives -pregnant or trying to get pregnant -breast-feeding How should I use this medicine? This device is placed inside the uterus by a health care professional. Talk to your pediatrician regarding the use of this medicine in children. Special care may be needed. Overdosage: If you think you have taken too much of this medicine contact a poison control center or emergency room at once. NOTE: This medicine is only for you. Do not share this medicine with others. What if I miss a dose? This does not apply. What may interact with this medicine? Do not take this medicine with any of the following  medications: -amprenavir -bosentan -fosamprenavir This medicine may also interact with the following medications: -aprepitant -barbiturate medicines for inducing sleep or treating seizures -bexarotene -griseofulvin -medicines to treat seizures like carbamazepine, ethotoin, felbamate, oxcarbazepine, phenytoin, topiramate -modafinil -pioglitazone -rifabutin -rifampin -rifapentine -some medicines to treat HIV infection like atazanavir, indinavir, lopinavir, nelfinavir, tipranavir, ritonavir -St. John's wort -warfarin This list may not describe all possible interactions. Give your health care provider a list of all the medicines, herbs, non-prescription drugs, or dietary supplements you use. Also tell them if you smoke, drink alcohol, or use illegal drugs. Some items may interact with your medicine. What should I watch for while using this medicine? Visit your doctor or health care professional for regular check ups. See your doctor if you or your partner has sexual contact with others, becomes HIV positive, or gets a sexual transmitted disease. This product does not protect you against HIV infection (AIDS) or other sexually transmitted diseases. You can check the placement of the IUD yourself by reaching up to the top of your vagina with clean fingers to feel the threads. Do not pull on the threads. It is a good habit to check placement after each menstrual period. Call your doctor right away if you feel more of the IUD than just the threads or if you cannot feel the threads at all. The IUD may come out by itself. You may become pregnant if the device comes out. If you notice that the IUD has come out use a backup birth control method like condoms and call your health care provider. Using tampons will not change the position of the IUD and are okay to use during your period. What side effects may I   notice from receiving this medicine? Side effects that you should report to your doctor or  health care professional as soon as possible: -allergic reactions like skin rash, itching or hives, swelling of the face, lips, or tongue -fever, flu-like symptoms -genital sores -high blood pressure -no menstrual period for 6 weeks during use -pain, swelling, warmth in the leg -pelvic pain or tenderness -severe or sudden headache -signs of pregnancy -stomach cramping -sudden shortness of breath -trouble with balance, talking, or walking -unusual vaginal bleeding, discharge -yellowing of the eyes or skin Side effects that usually do not require medical attention (report to your doctor or health care professional if they continue or are bothersome): -acne -breast pain -change in sex drive or performance -changes in weight -cramping, dizziness, or faintness while the device is being inserted -headache -irregular menstrual bleeding within first 3 to 6 months of use -nausea This list may not describe all possible side effects. Call your doctor for medical advice about side effects. You may report side effects to FDA at 1-800-FDA-1088. Where should I keep my medicine? This does not apply. NOTE: This sheet is a summary. It may not cover all possible information. If you have questions about this medicine, talk to your doctor, pharmacist, or health care provider.  2014, Elsevier/Gold Standard. (2011-07-10 13:54:04)  

## 2014-03-09 ENCOUNTER — Other Ambulatory Visit: Payer: Self-pay | Admitting: Gynecology

## 2014-03-09 DIAGNOSIS — Z1231 Encounter for screening mammogram for malignant neoplasm of breast: Secondary | ICD-10-CM

## 2014-03-16 ENCOUNTER — Ambulatory Visit (HOSPITAL_COMMUNITY): Payer: BC Managed Care – PPO

## 2014-03-16 ENCOUNTER — Ambulatory Visit (HOSPITAL_COMMUNITY)
Admission: RE | Admit: 2014-03-16 | Discharge: 2014-03-16 | Disposition: A | Discharge status: Home / Self Care | Payer: BC Managed Care – PPO | Source: Ambulatory Visit | Attending: Gynecology | Admitting: Gynecology

## 2014-03-16 DIAGNOSIS — Z1231 Encounter for screening mammogram for malignant neoplasm of breast: Secondary | ICD-10-CM | POA: Insufficient documentation

## 2014-04-24 ENCOUNTER — Encounter: Payer: Self-pay | Admitting: Gynecology

## 2014-08-21 ENCOUNTER — Ambulatory Visit (INDEPENDENT_AMBULATORY_CARE_PROVIDER_SITE_OTHER): Payer: BLUE CROSS/BLUE SHIELD | Admitting: Family Medicine

## 2014-08-21 ENCOUNTER — Ambulatory Visit (INDEPENDENT_AMBULATORY_CARE_PROVIDER_SITE_OTHER): Payer: BLUE CROSS/BLUE SHIELD

## 2014-08-21 VITALS — BP 120/80 | HR 78 | Temp 97.6°F | Resp 16 | Ht 65.0 in | Wt 141.0 lb

## 2014-08-21 DIAGNOSIS — M546 Pain in thoracic spine: Secondary | ICD-10-CM

## 2014-08-21 DIAGNOSIS — M549 Dorsalgia, unspecified: Secondary | ICD-10-CM

## 2014-08-21 DIAGNOSIS — K219 Gastro-esophageal reflux disease without esophagitis: Secondary | ICD-10-CM | POA: Diagnosis not present

## 2014-08-21 DIAGNOSIS — R079 Chest pain, unspecified: Secondary | ICD-10-CM

## 2014-08-21 LAB — POCT CBC
Granulocyte percent: 66.3 %G (ref 37–80)
HCT, POC: 44.4 % (ref 37.7–47.9)
Hemoglobin: 14.3 g/dL (ref 12.2–16.2)
Lymph, poc: 2.4 (ref 0.6–3.4)
MCH, POC: 30.1 pg (ref 27–31.2)
MCHC: 32.2 g/dL (ref 31.8–35.4)
MCV: 93.5 fL (ref 80–97)
MID (cbc): 0.6 (ref 0–0.9)
MPV: 6.8 fL (ref 0–99.8)
POC Granulocyte: 5.8 (ref 2–6.9)
POC LYMPH PERCENT: 27.1 %L (ref 10–50)
POC MID %: 6.6 % (ref 0–12)
Platelet Count, POC: 263 10*3/uL (ref 142–424)
RBC: 4.75 M/uL (ref 4.04–5.48)
RDW, POC: 13 %
WBC: 8.8 10*3/uL (ref 4.6–10.2)

## 2014-08-21 LAB — COMPLETE METABOLIC PANEL WITH GFR
ALT: 10 U/L (ref 0–35)
AST: 16 U/L (ref 0–37)
Albumin: 4.2 g/dL (ref 3.5–5.2)
Alkaline Phosphatase: 45 U/L (ref 39–117)
BUN: 12 mg/dL (ref 6–23)
Chloride: 101 mEq/L (ref 96–112)
Glucose, Bld: 82 mg/dL (ref 70–99)
Potassium: 4.3 mEq/L (ref 3.5–5.3)
Sodium: 138 mEq/L (ref 135–145)
Total Bilirubin: 0.4 mg/dL (ref 0.2–1.2)
Total Protein: 7.4 g/dL (ref 6.0–8.3)

## 2014-08-21 LAB — COMPLETE METABOLIC PANEL WITHOUT GFR
CO2: 27 meq/L (ref 19–32)
Calcium: 9.4 mg/dL (ref 8.4–10.5)
Creat: 0.63 mg/dL (ref 0.50–1.10)
GFR, Est African American: >89 mL/min
GFR, Est Non African American: >89 mL/min

## 2014-08-21 LAB — TROPONIN I: Troponin I: <0.01 ng/mL (ref <0.06)

## 2014-08-21 LAB — TSH: TSH: 0.815 u[IU]/mL (ref 0.350–4.500)

## 2014-08-21 MED ORDER — OMEPRAZOLE 20 MG PO CPDR: 20.0000 mg | DELAYED_RELEASE_CAPSULE | Freq: Every day | ORAL | Status: DC

## 2014-08-21 NOTE — Patient Instructions (Signed)
Heartburn Heartburn is a painful, burning sensation in the chest. It may feel worse in certain positions, such as lying down or bending over. It is caused by stomach acid backing up into the tube that carries food from the mouth down to the stomach (lower esophagus).  CAUSES   Large meals.  Certain foods and drinks.  Exercise.  Increased acid production.  Being overweight or obese.  Certain medicines. SYMPTOMS   Burning pain in the chest or lower throat.  Bitter taste in the mouth.  Coughing. DIAGNOSIS  If the usual treatments for heartburn do not improve your symptoms, then tests may be done to see if there is another condition present. Possible tests may include:  X-rays.  Endoscopy. This is when a tube with a light and a camera on the end is used to examine the esophagus and the stomach.  A test to measure the amount of acid in the esophagus (pH test).  A test to see if the esophagus is working properly (esophageal manometry).  Blood, breath, or stool tests to check for bacteria that cause ulcers. TREATMENT   Your caregiver may tell you to use certain over-the-counter medicines (antacids, acid reducers) for mild heartburn.  Your caregiver may prescribe medicines to decrease the acid in your stomach or protect your stomach lining.  Your caregiver may recommend certain diet changes.  For severe cases, your caregiver may recommend that the head of your bed be elevated on blocks. (Sleeping with more pillows is not an effective treatment as it only changes the position of your head and does not improve the main problem of stomach acid refluxing into the esophagus.) HOME CARE INSTRUCTIONS   Take all medicines as directed by your caregiver.  Raise the head of your bed by putting blocks under the legs if instructed to by your caregiver.  Do not exercise right after eating.  Avoid eating 2 or 3 hours before bed. Do not lie down right after eating.  Eat small meals  throughout the day instead of 3 large meals.  Stop smoking if you smoke.  Maintain a healthy weight.  Identify foods and beverages that make your symptoms worse and avoid them. Foods you may want to avoid include:  Peppers.  Chocolate.  High-fat foods, including fried foods.  Spicy foods.  Garlic and onions.  Citrus fruits, including oranges, grapefruit, lemons, and limes.  Food containing tomatoes or tomato products.  Mint.  Carbonated drinks, caffeinated drinks, and alcohol.  Vinegar. SEEK IMMEDIATE MEDICAL CARE IF:  You have severe chest pain that goes down your arm or into your jaw or neck.  You feel sweaty, dizzy, or lightheaded.  You are short of breath.  You vomit blood.  You have difficulty or pain with swallowing.  You have bloody or black, tarry stools.  You have episodes of heartburn more than 3 times a week for more than 2 weeks. MAKE SURE YOU:  Understand these instructions.  Will watch your condition.  Will get help right away if you are not doing well or get worse. Document Released: 10/26/2008 Document Revised: 09/01/2011 Document Reviewed: 11/24/2010 [facility] Patient Information 2015 [place]. This information is not intended to replace advice given to you by your health care provider. Make sure you discuss any questions you have with your health care provider.   Food Choices for Gastroesophageal Reflux Disease When you have gastroesophageal reflux disease (GERD), the foods you eat and your eating habits are very important. Choosing the right foods can  help ease the discomfort of GERD. WHAT GENERAL GUIDELINES DO I NEED TO FOLLOW?  Choose fruits, vegetables, whole grains, low-fat dairy products, and low-fat meat, fish, and poultry.  Limit fats such as oils, salad dressings, butter, nuts, and avocado.  Keep a food diary to identify foods that cause symptoms.  Avoid foods that cause reflux. These may be different for different  people.  Eat frequent small meals instead of three large meals each day.  Eat your meals slowly, in a relaxed setting.  Limit fried foods.  Cook foods using methods other than frying.  Avoid drinking alcohol.  Avoid drinking large amounts of liquids with your meals.  Avoid bending over or lying down until 2-3 hours after eating. WHAT FOODS ARE NOT RECOMMENDED? The following are some foods and drinks that may worsen your symptoms: Vegetables Tomatoes. Tomato juice. Tomato and spaghetti sauce. Chili peppers. Onion and garlic. Horseradish. Fruits Oranges, grapefruit, and lemon (fruit and juice). Meats High-fat meats, fish, and poultry. This includes hot dogs, ribs, ham, sausage, salami, and bacon. Dairy Whole milk and chocolate milk. Sour cream. Cream. Butter. Ice cream. Cream cheese.  Beverages Coffee and tea, with or without caffeine. Carbonated beverages or energy drinks. Condiments Hot sauce. Barbecue sauce.  Sweets/Desserts Chocolate and cocoa. Donuts. Peppermint and spearmint. Fats and Oils High-fat foods, including [place] fries and potato chips. Other Vinegar. Strong spices, such as black pepper, white pepper, red pepper, cayenne, curry powder, cloves, ginger, and chili powder. The items listed above may not be a complete list of foods and beverages to avoid. Contact your dietitian for more information. Document Released: 06/09/2005 Document Revised: 06/14/2013 Document Reviewed: 04/13/2013 [facility] Patient Information 2015 [place], [place]. This information is not intended to replace advice given to you by your health care provider. Make sure you discuss any questions you have with your health care provider.

## 2014-08-21 NOTE — Progress Notes (Signed)
Chief Complaint:  Chief Complaint  Patient presents with  . Chest Pain    x 2 weeks  . Back Pain    x 2 weeks    HPI: Karen Thornton is a 44 y.o. female who is here for  Has had chest pain for the last 2 weeks on and off , feels like pins and needles , has had this before but last night she was "full of air " and it was going to her back and felt uncomfortable. She has CP when she takes a deep breath as well. She has middle back pain. LAst night she had air pushing up. SShe has pain , sometimes a few seconds, sometimes 30 mintues. It goes away by itself.  She is ussually having pain when she picks up heavy things, when she is tanding, usually at work. But last night she felt tightness in bed.  She deneis HTN , high cholesterol, thyroid problems  She is nauseated, wants to throaw up sometimes She felt soemthing uncomfortable in her throat and felt all fromt he middl e of her throat, after she eats/ She has had heartburn but never at night  No swelling in legs, no palpitations, SOb  BP Readings from Last 3 Encounters:  08/21/14 120/80  11/22/13 94/60  11/11/13 108/70   Wt Readings from Last 3 Encounters:  08/21/14 141 lb (63.957 kg)  11/22/13 149 lb (67.586 kg)  11/11/13 138 lb (62.596 kg)     Past Medical History  Diagnosis Date  . 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 Breast biopsy Right 01/2013   History   Social History  . Marital Status: Divorced    Spouse Name: N/A  . Number of Children: 1  . Years of Education: N/A   Occupational History  . nail technician    Social History Main Topics  . Smoking status: Never Smoker   . Smokeless tobacco: Never Used  . Alcohol Use: 0.6 oz/week    1 Glasses of wine per week     Comment: rare social  . Drug Use: No  . Sexual Activity:    Partners: Male    Pharmacist, hospitalBirth Control/ Protection: None   Other Topics Concern  . None   Social History Narrative   From [place] - moved to [place] in [place]   Family History  Problem Relation Age of Onset  . Stroke Mother   . Diabetes Mother   . Cancer Father [id number]    liver  . Diabetes Brother    Allergies  Allergen Reactions  . Asa [Aspirin] Nausea And Vomiting  . Ibuprofen     REACTION: GI Upset   Prior to Admission medications   Medication Sig Start Date End Date Taking? Authorizing Provider  Multiple Vitamins-Minerals (HAIR/SKIN/NAILS) TABS Take by mouth daily.   Yes Historical Provider, MD     ROS: The patient denies fevers, chills, night sweats, unintentional weight loss, palpitations, wheezing, dyspnea on exertion, nausea, vomiting, abdominal pain, dysuria, hematuria, melena, numbness, weakness, or tingling.   All other systems have been reviewed and were otherwise negative with the exception of those mentioned in the HPI and as above.    PHYSICAL EXAM: Filed Vitals:   08/21/14 0950  BP: 120/80  Pulse: 78  Temp: 97.6 F (36.4 C)  Resp: 16   Filed Vitals:   08/21/14 0950  Height: 5\' 5"  (1.651 m)  Weight: 141 lb (63.957 kg)   Body mass index is 23.46 kg/(m^2).  General: Alert, no acute distress HEENT:  Normocephalic, atraumatic, oropharynx patent. EOMI, PERRLA Cardiovascular:  Regular rate and rhythm, no rubs murmurs or gallops.  No Carotid bruits, radial pulse intact. No pedal edema.  Respiratory: Clear to auscultation bilaterally.  No wheezes, rales, or rhonchi.  No cyanosis, no use of accessory musculature GI: No organomegaly, abdomen is soft and non-tender, positive bowel sounds.  No masses. Skin: No rashes. Neurologic: Facial musculature symmetric. Psychiatric: Patient is appropriate throughout our interaction. Lymphatic: No cervical lymphadenopathy Musculoskeletal: Gait intact.   LABS: Results for orders placed or performed in visit on 08/21/14  POCT CBC  Result Value Ref Range   WBC 8.8 4.6 - 10.2 K/uL   Lymph, poc 2.4 0.6 - 3.4   POC LYMPH PERCENT 27.1 10 - 50 %L    MID (cbc) 0.6 0 - 0.9   POC MID % 6.6 0 - 12 %M   POC Granulocyte 5.8 2 - 6.9   Granulocyte percent 66.3 37 - 80 %G   RBC 4.75 4.04 - 5.48 M/uL   Hemoglobin 14.3 12.2 - 16.2 g/dL   HCT, POC 40.9 81.1 - 47.9 %   MCV 93.5 80 - 97 fL   MCH, POC 30.1 27 - 31.2 pg   MCHC 32.2 31.8 - 35.4 g/dL   RDW, POC 91.4 %   Platelet Count, POC 263 142 - 424 K/uL   MPV 6.8 0 - 99.8 fL     EKG/XRAY:   Primary read interpreted by Dr. Conley Rolls at [facility]. Chest xray is normal EKG is Normal Normal xray   ASSESSMENT/PLAN: Encounter Diagnoses  Name Primary?  . Chest pain, unspecified chest pain type Yes  . Upper back pain   . Gastroesophageal reflux disease, esophagitis presence not specified    Pleasant 44 y/o [place] female with no risk factors for CAD who is here with atypical CP c/w GERD Chest xray , labs so far and also EKG were all reassuring Labs pending: CMP, TSH, lipids, troponin i  GERD  rx prilosec and diet given If worsening sxs precautions given Fu prn   Gross sideeffects, risk and benefits, and alternatives of medications d/w patient. Patient is aware that all medications have potential sideeffects and we are unable to predict every sideeffect or drug-drug interaction that may occur.  Pedrohenrique Mcconville PHUONG, DO 08/21/2014 11:51 AM

## 2014-08-31 ENCOUNTER — Encounter: Payer: Self-pay | Admitting: Family Medicine

## 2014-09-01 ENCOUNTER — Telehealth: Payer: Self-pay | Admitting: *Deleted

## 2014-09-01 NOTE — Telephone Encounter (Signed)
Pt calling in regards to lab results .Looks like they have yet to be reviewed please advise .

## 2014-09-03 NOTE — Telephone Encounter (Signed)
I already sent a letter but did call her and LM that labs were normal.

## 2015-01-24 ENCOUNTER — Ambulatory Visit (INDEPENDENT_AMBULATORY_CARE_PROVIDER_SITE_OTHER): Payer: 59 | Admitting: Physician Assistant

## 2015-01-24 VITALS — BP 124/72 | HR 72 | Temp 97.8°F | Resp 16 | Ht 65.0 in | Wt 141.0 lb

## 2015-01-24 DIAGNOSIS — Z124 Encounter for screening for malignant neoplasm of cervix: Secondary | ICD-10-CM | POA: Diagnosis not present

## 2015-01-24 DIAGNOSIS — Z1239 Encounter for other screening for malignant neoplasm of breast: Secondary | ICD-10-CM | POA: Diagnosis not present

## 2015-01-24 DIAGNOSIS — Z130 Encounter for screening for diseases of the blood and blood-forming organs and certain disorders involving the immune mechanism: Secondary | ICD-10-CM

## 2015-01-24 DIAGNOSIS — Z1329 Encounter for screening for other suspected endocrine disorder: Secondary | ICD-10-CM

## 2015-01-24 DIAGNOSIS — Z0000 Encounter for general adult medical examination without abnormal findings: Secondary | ICD-10-CM | POA: Diagnosis not present

## 2015-01-24 DIAGNOSIS — Z13220 Encounter for screening for lipoid disorders: Secondary | ICD-10-CM | POA: Diagnosis not present

## 2015-01-24 DIAGNOSIS — N889 Noninflammatory disorder of cervix uteri, unspecified: Secondary | ICD-10-CM | POA: Diagnosis not present

## 2015-01-24 DIAGNOSIS — Z131 Encounter for screening for diabetes mellitus: Secondary | ICD-10-CM | POA: Diagnosis not present

## 2015-01-24 LAB — COMPREHENSIVE METABOLIC PANEL
ALBUMIN: 4 g/dL (ref 3.6–5.1)
ALK PHOS: 43 U/L (ref 33–115)
ALT: 7 U/L (ref 6–29)
AST: 17 U/L (ref 10–30)
BILIRUBIN TOTAL: 0.5 mg/dL (ref 0.2–1.2)
BUN: 13 mg/dL (ref 7–25)
CALCIUM: 9.5 mg/dL (ref 8.6–10.2)
CHLORIDE: 103 mmol/L (ref 98–110)
CO2: 26 mmol/L (ref 20–31)
Creat: 0.66 mg/dL (ref 0.50–1.10)
Glucose, Bld: 83 mg/dL (ref 65–99)
Potassium: 4.4 mmol/L (ref 3.5–5.3)
Sodium: 140 mmol/L (ref 135–146)
Total Protein: 7.8 g/dL (ref 6.1–8.1)

## 2015-01-24 LAB — TSH: TSH: 0.862 u[IU]/mL (ref 0.350–4.500)

## 2015-01-24 LAB — CBC
HCT: 43 % (ref 36.0–46.0)
HEMOGLOBIN: 14.3 g/dL (ref 12.0–15.0)
MCH: 29.7 pg (ref 26.0–34.0)
MCHC: 33.3 g/dL (ref 30.0–36.0)
MCV: 89.4 fL (ref 78.0–100.0)
MPV: 9.6 fL (ref 8.6–12.4)
Platelets: 298 10*3/uL (ref 150–400)
RBC: 4.81 MIL/uL (ref 3.87–5.11)
RDW: 12.9 % (ref 11.5–15.5)
WBC: 8.8 10*3/uL (ref 4.0–10.5)

## 2015-01-24 LAB — LIPID PANEL
CHOL/HDL RATIO: 2.7 ratio (ref <=5.0)
Cholesterol: 147 mg/dL (ref 125–200)
HDL: 54 mg/dL (ref >=46)
LDL Cholesterol: 70 mg/dL (ref <130)
Triglycerides: 116 mg/dL (ref <150)
VLDL: 23 mg/dL (ref <30)

## 2015-01-24 NOTE — Patient Instructions (Signed)

## 2015-01-24 NOTE — Progress Notes (Signed)
Subjective:    Patient ID: Karen Thornton, female    DOB: 07/14/1970, 44 y.o.   MRN: [id number]  HPI Patient presents for complete physical without any complaints. PMH GERD that is controlled with prilosec. Has had CP occasionally related with GERD. Has also had sweating when she first wakes up in the morning, but denies fatigue, wt loss, hot flashes   Works as a Microbiologist and enjoys job. Walks for 15 mins 3 days/week for exercise. Has never smoked, used illicit drugs, or drank alcohol. Is divorced and is occasionally sexually active with men and uses condoms. Denies vaginal pain, bleeding, or discharge. States that some cycles are heavier than others, but that is intermittent.   Health Maintenance: LMP 12/31/14 was normal. Last pap 2015 was normal. Last mammo 2015 was normal as well. Eye and dental exams UTD.   Review of Systems  Constitutional: Positive for diaphoresis. Negative for fever, activity change, appetite change, fatigue and unexpected weight change.  HENT: Negative.  Negative for tinnitus.   Eyes: Negative.  Negative for photophobia and visual disturbance.  Respiratory: Negative.   Cardiovascular: Positive for chest pain (secondary to GERD).  Gastrointestinal: Negative for nausea, vomiting, abdominal pain, diarrhea and constipation.  Genitourinary: Negative for dysuria, urgency, frequency, hematuria, decreased urine volume, vaginal bleeding, vaginal discharge, genital sores, vaginal pain, menstrual problem and pelvic pain.  Neurological: Negative for dizziness, weakness, light-headedness and headaches.  Hematological: Negative for adenopathy.  Psychiatric/Behavioral: Negative.  Negative for behavioral problems and dysphoric mood. The patient is not nervous/anxious.        Objective:   Physical Exam  Constitutional: She is oriented to person, place, and time. She appears well-developed and well-nourished. No distress.  Blood pressure 124/72, pulse 72,  temperature 97.8 F (36.6 C), temperature source Oral, resp. rate 16, height 5\' 5"  (1.651 m), weight 141 lb (63.957 kg), last menstrual period 12/31/2014, SpO2 98 %.  HENT:  Head: Normocephalic and atraumatic.  Right Ear: External ear normal.  Left Ear: External ear normal.  Mouth/Throat: Oropharynx is clear and moist.  Eyes: Conjunctivae and EOM are normal. Pupils are equal, round, and reactive to light. Right eye exhibits no discharge. Left eye exhibits no discharge. No scleral icterus.  Neck: Normal range of motion. Neck supple. No JVD present. No thyromegaly present.  Cardiovascular: Normal rate, regular rhythm, normal heart sounds and intact distal pulses.  Exam reveals no gallop and no friction rub.   No murmur heard. Pulmonary/Chest: Effort normal and breath sounds normal. No respiratory distress. She has no wheezes. She has no rales. She exhibits no tenderness.  Abdominal: Soft. Bowel sounds are normal. She exhibits no distension and no mass. There is no tenderness. There is no rebound and no guarding. Hernia confirmed negative in the right inguinal area and confirmed negative in the left inguinal area.  Genitourinary: Vagina normal and uterus normal. No breast swelling, tenderness, discharge or bleeding. There is no rash, tenderness, lesion or injury on the right labia. There is no rash, tenderness, lesion or injury on the left labia. Cervix exhibits no motion tenderness, no discharge and no friability. Right adnexum displays no mass, no tenderness and no fullness. Left adnexum displays no mass, no tenderness and no fullness. No erythema, tenderness or bleeding in the vagina. No foreign body around the vagina. No signs of injury around the vagina. No vaginal discharge found.    Musculoskeletal: Normal range of motion. She exhibits no edema or tenderness.  Lymphadenopathy:  She has no cervical adenopathy.       Right: No inguinal adenopathy present.       Left: No inguinal adenopathy  present.  Neurological: She is alert and oriented to person, place, and time. She has normal reflexes. No cranial nerve deficit. She exhibits normal muscle tone. Coordination normal.  Skin: Skin is warm and dry. No rash noted. She is not diaphoretic. No erythema. No pallor.  Psychiatric: She has a normal mood and affect. Her behavior is normal. Judgment and thought content normal.        Assessment & Plan:  1. Annual physical exam Anticipatory guidance given.  2. Screening for cholesterol level - Lipid panel  3. Screening for thyroid disorder - TSH  4. Screening for diabetes mellitus - Comprehensive metabolic panel  5. Cervical lesion - Herpes simplex virus culture - Ambulatory referral to Gynecology  6. Screening for cervical cancer - Pap IG, CT/NG NAA, and HPV (high risk)  7. Screening breast examination - MM Digital Diagnostic Bilat; Future  8. Screening for deficiency anemia - CBC   Lason Eveland PA-C  [facility] 01/24/2015 12:36 PM

## 2015-01-26 LAB — HERPES SIMPLEX VIRUS CULTURE: ORGANISM ID, BACTERIA: NOT DETECTED

## 2015-01-26 LAB — PAP IG, CT-NG NAA, HPV HIGH-RISK
CHLAMYDIA PROBE AMP: NEGATIVE
GC Probe Amp: NEGATIVE
HPV DNA HIGH RISK: NOT DETECTED

## 2015-02-05 ENCOUNTER — Telehealth: Payer: Self-pay

## 2015-02-05 NOTE — Telephone Encounter (Signed)
Pt is calling requesting lab results. Please comment on results. Thank you.

## 2015-02-06 ENCOUNTER — Telehealth: Payer: Self-pay | Admitting: Physician Assistant

## 2015-02-06 NOTE — Telephone Encounter (Signed)
Left vmail. Labs are all normal. Due to finding on cervix with pelvic exam should still keep gyn appt.

## 2015-03-15 ENCOUNTER — Other Ambulatory Visit: Payer: Self-pay | Admitting: Physician Assistant

## 2015-03-15 ENCOUNTER — Other Ambulatory Visit (HOSPITAL_COMMUNITY): Payer: Self-pay | Admitting: Gynecology

## 2015-03-15 DIAGNOSIS — Z1231 Encounter for screening mammogram for malignant neoplasm of breast: Secondary | ICD-10-CM

## 2015-03-21 ENCOUNTER — Ambulatory Visit (HOSPITAL_COMMUNITY)
Admission: RE | Admit: 2015-03-21 | Discharge: 2015-03-21 | Disposition: A | Discharge status: Home / Self Care | Payer: 59 | Source: Ambulatory Visit | Attending: Physician Assistant | Admitting: Physician Assistant

## 2015-03-21 DIAGNOSIS — Z1231 Encounter for screening mammogram for malignant neoplasm of breast: Secondary | ICD-10-CM | POA: Diagnosis not present

## 2015-07-19 ENCOUNTER — Encounter: Payer: Self-pay | Admitting: Obstetrics and Gynecology

## 2015-07-19 ENCOUNTER — Ambulatory Visit (INDEPENDENT_AMBULATORY_CARE_PROVIDER_SITE_OTHER): Payer: BLUE CROSS/BLUE SHIELD | Admitting: Obstetrics and Gynecology

## 2015-07-19 VITALS — BP 110/66 | HR 66 | Resp 21 | Ht 65.0 in | Wt 149.0 lb

## 2015-07-19 DIAGNOSIS — N920 Excessive and frequent menstruation with regular cycle: Secondary | ICD-10-CM | POA: Diagnosis not present

## 2015-07-19 DIAGNOSIS — Z01419 Encounter for gynecological examination (general) (routine) without abnormal findings: Secondary | ICD-10-CM | POA: Diagnosis not present

## 2015-07-19 DIAGNOSIS — Z0000 Encounter for general adult medical examination without abnormal findings: Secondary | ICD-10-CM | POA: Diagnosis not present

## 2015-07-19 LAB — POCT URINALYSIS DIPSTICK
BILIRUBIN UA: NEGATIVE
Glucose, UA: NEGATIVE
KETONES UA: NEGATIVE
Leukocytes, UA: NEGATIVE
Nitrite, UA: NEGATIVE
Protein, UA: NEGATIVE
Urobilinogen, UA: NEGATIVE
pH, UA: 5

## 2015-07-19 MED ORDER — TRANEXAMIC ACID 650 MG PO TABS: 1300.0000 mg | ORAL_TABLET | Freq: Three times a day (TID) | ORAL | Status: DC

## 2015-07-19 NOTE — Patient Instructions (Addendum)

## 2015-07-19 NOTE — Progress Notes (Signed)
Patient ID: Karen Thornton, female   DOB: 03/12/1971, 45 y.o.   MRN: [id number] 45 y.o. [id number] Divorced Asian female here for annual exam.   I cared for pregnancy in prior office.   Menses heavy.  Monthly menses, using pad and tampon.  Changes pain 4 -5 times per day.    No real pain.  Had pelvic ultrasound in June 2015 - possible adenomyosis. Had very small fibroid also.   Had full labs in August 2016 - all normal.. Normal TSH and CBC included.  Some right sided pain when sleeping.  Sleeps on her right side.   Had melasma in past with birth control use.  Cream expensive to use.   From [place]. Nail technician and esthetician.  Children - all doing well.  PCP:  Elvina Sidle, MD   Patient's last menstrual period was 07/12/2015 (exact date).          Sexually active: Yes.   female.    The current method of family planning is none.   Rhythm method.  Declines birth control.  Exercising: Yes.    walks and runs in her back yard. Smoker:  no  Health Maintenance: Pap:  01-24-15 Neg:Neg HR HPV History of abnormal Pap:  no MMG:  03-22-15 Density Cat.C/neg/BiRads1:[facility] Colonoscopy:  n/a BMD:   n/a  Result  n/a TDaP:  11-30-12 Screening Labs:  Hb today: PCP, Urine today: Trace RBCs--see LMP   reports that she has never smoked. She has never used smokeless tobacco. She reports that she does not drink alcohol or use illicit drugs.  Past Medical History  Diagnosis Date  . Calcification of right breast 01/2013    Benign   . GERD (gastroesophageal reflux disease)     Past Surgical History  Procedure Laterality Date  . Breast biopsy Right 01/2013    Current Outpatient Prescriptions  Medication Sig Dispense Refill  . Multiple Vitamins-Minerals (HAIR/SKIN/NAILS) TABS Take by mouth daily.    Marland Kitchen omeprazole (PRILOSEC) 20 MG capsule Take 1 capsule (20 mg total) by mouth daily. (Patient taking differently: Take 20 mg by mouth as needed. ) 30 capsule 11   No current  facility-administered medications for this visit.    Family History  Problem Relation Age of Onset  . Stroke Mother   . Diabetes Mother   . Heart disease Mother   . Hyperlipidemia Mother   . Hypertension Mother   . Cancer Father 104    liver  . Diabetes Brother     ROS:  Pertinent items are noted in HPI.  Otherwise, a comprehensive ROS was negative.  Exam:   BP 110/66 mmHg  Pulse 66  Resp 21  Ht  (1.651 m)  Wt 149 lb (67.586 kg)  BMI 24.79 kg/m2  LMP 07/12/2015 (Exact Date)    General appearance: alert, cooperative and appears stated age Head: Normocephalic, without obvious abnormality, atraumatic Neck: no adenopathy, supple, symmetrical, trachea midline and thyroid normal to inspection and palpation Lungs: clear to auscultation bilaterally Breasts: normal appearance, no masses or tenderness, Inspection negative, No nipple retraction or dimpling, No nipple discharge or bleeding, No axillary or supraclavicular adenopathy, right breast at 10:00 with raised very dark smooth nevus (states old change.)  Measures 7 mm.  Heart: regular rate and rhythm Abdomen: soft, non-tender; bowel sounds normal; no masses,  no organomegaly Extremities: extremities normal, atraumatic, no cyanosis or edema Skin: Skin color, texture, turgor normal. No rashes or lesions Lymph nodes: Cervical, supraclavicular, and axillary nodes normal.  No abnormal inguinal nodes palpated Neurologic: Grossly normal  Pelvic: External genitalia:  no lesions              Urethra:  normal appearing urethra with no masses, tenderness or lesions              Bartholins and Skenes: normal                 Vagina: normal appearing vagina with normal color and discharge, no lesions              Cervix: no lesions              Pap taken: No. Bimanual Exam:  Uterus:  normal size, contour, position, consistency, mobility, non-tender              Adnexa: normal adnexa and no mass, fullness, tenderness               Rectovaginal: Yes.  .  Confirms.              Anus:  normal sphincter tone, no lesions  Chaperone was present for exam.  Assessment:   Well woman visit with normal exam. Menorrhagia.  Hx melasma. Pigmented nevus.   Plan: Yearly mammogram recommended after age 61.  Recommended self breast exam.  Pap and HR HPV as above. Discussed Calcium, Vitamin D, regular exercise program including cardiovascular and weight bearing exercise. Labs performed.  Yes.  .   See orders.  Hgb capillary. Refills given on medications.  Yes.  .  See orders.  Lysteda. 650 mg 2 po tid for 5 days during menses.  Instructed in use.  Discussed risks of DVT, PE, MI, and stroke. Discussed condom use.  Will see derm for nevus check.   Established with Dr. Dorita Sciara office. Follow up annually and prn.      After visit summary provided.

## 2015-07-20 LAB — HEMOGLOBIN, FINGERSTICK: HEMOGLOBIN, FINGERSTICK: 11.4 g/dL — AB (ref 12.0–16.0)

## 2016-07-24 DIAGNOSIS — R7989 Other specified abnormal findings of blood chemistry: Secondary | ICD-10-CM

## 2016-07-24 HISTORY — DX: Other specified abnormal findings of blood chemistry: R79.89

## 2016-08-06 ENCOUNTER — Other Ambulatory Visit: Payer: Self-pay | Admitting: Obstetrics and Gynecology

## 2016-08-06 ENCOUNTER — Encounter: Payer: Self-pay | Admitting: Obstetrics and Gynecology

## 2016-08-06 ENCOUNTER — Ambulatory Visit: Payer: BLUE CROSS/BLUE SHIELD | Admitting: Obstetrics and Gynecology

## 2016-08-06 ENCOUNTER — Ambulatory Visit (INDEPENDENT_AMBULATORY_CARE_PROVIDER_SITE_OTHER): Payer: BLUE CROSS/BLUE SHIELD | Admitting: Obstetrics and Gynecology

## 2016-08-06 VITALS — BP 110/68 | HR 80 | Resp 16 | Wt 150.0 lb

## 2016-08-06 DIAGNOSIS — Z113 Encounter for screening for infections with a predominantly sexual mode of transmission: Secondary | ICD-10-CM | POA: Diagnosis not present

## 2016-08-06 DIAGNOSIS — R7989 Other specified abnormal findings of blood chemistry: Secondary | ICD-10-CM

## 2016-08-06 DIAGNOSIS — Z0000 Encounter for general adult medical examination without abnormal findings: Secondary | ICD-10-CM | POA: Diagnosis not present

## 2016-08-06 DIAGNOSIS — Z01419 Encounter for gynecological examination (general) (routine) without abnormal findings: Secondary | ICD-10-CM | POA: Diagnosis not present

## 2016-08-06 LAB — COMPREHENSIVE METABOLIC PANEL
ALT: 6 U/L (ref 6–29)
AST: 15 U/L (ref 10–35)
Albumin: 4 g/dL (ref 3.6–5.1)
Alkaline Phosphatase: 47 U/L (ref 33–115)
BUN: 9 mg/dL (ref 7–25)
CALCIUM: 9.1 mg/dL (ref 8.6–10.2)
CHLORIDE: 104 mmol/L (ref 98–110)
CO2: 30 mmol/L (ref 20–31)
Creat: 0.91 mg/dL (ref 0.50–1.10)
Glucose, Bld: 107 mg/dL — ABNORMAL HIGH (ref 65–99)
POTASSIUM: 3.8 mmol/L (ref 3.5–5.3)
SODIUM: 140 mmol/L (ref 135–146)
Total Bilirubin: 0.2 mg/dL (ref 0.2–1.2)
Total Protein: 7 g/dL (ref 6.1–8.1)

## 2016-08-06 LAB — LIPID PANEL
CHOL/HDL RATIO: 3.7 ratio (ref <5.0)
Cholesterol: 140 mg/dL (ref <200)
HDL: 38 mg/dL — AB (ref >50)
LDL Cholesterol: 69 mg/dL (ref <100)
Triglycerides: 166 mg/dL — ABNORMAL HIGH (ref <150)
VLDL: 33 mg/dL — ABNORMAL HIGH (ref <30)

## 2016-08-06 LAB — CBC
HEMATOCRIT: 39 % (ref 35.0–45.0)
HEMOGLOBIN: 12.6 g/dL (ref 11.7–15.5)
MCH: 27.6 pg (ref 27.0–33.0)
MCHC: 32.3 g/dL (ref 32.0–36.0)
MCV: 85.5 fL (ref 80.0–100.0)
MPV: 9 fL (ref 7.5–12.5)
Platelets: 290 10*3/uL (ref 140–400)
RBC: 4.56 MIL/uL (ref 3.80–5.10)
RDW: 14.8 % (ref 11.0–15.0)
WBC: 6.6 10*3/uL (ref 3.8–10.8)

## 2016-08-06 LAB — TSH: TSH: 0.33 m[IU]/L — AB

## 2016-08-06 LAB — POCT URINALYSIS DIPSTICK
Bilirubin, UA: NEGATIVE
CLARITY UA: NEGATIVE
Glucose, UA: NEGATIVE
KETONES UA: NEGATIVE
LEUKOCYTES UA: NEGATIVE
Nitrite, UA: NEGATIVE
PROTEIN UA: NEGATIVE
Urobilinogen, UA: NEGATIVE
pH, UA: 5

## 2016-08-06 NOTE — Progress Notes (Signed)
46 y.o. [id number] Divorced Asian female here for annual exam.    Hx melasma.   This month a little more painful.  Menses are 3 - 5 days.  Menses are lighter now.  Not using Lysteda.  Wants routine labs today.   No new partners.  No prior STD testing since this partner.   Urinary frequency.  Drinks a lot of water.  No dysuria.   Back pain if she stands for a long period of time.  Goes away with resting.   Hx right breast biopsy.   [facility]  Patient's last menstrual period was 08/01/2016 (exact date).     Period Cycle (Days): 30 Period Duration (Days): 3-5 Period Pattern: Regular Menstrual Flow:  (heavy 2nd and 3rd day) Menstrual Control: Maxi pad Dysmenorrhea Symptoms: Diarrhea     Sexually active: Yes.   female The current method of family planning is rhythm method.    Exercising: Yes.     Smoker:  no  Health Maintenance: Pap: 01-24-15 Neg:Neg HR HPV, 11-30-12 Neg:Neg HR HPV History of abnormal Pap:  no MMG: 03-21-15 Density C/Neg/BiRads1:[facility] Colonoscopy:  n/a BMD:   n/a  Result  n/a TDaP:  11-30-12 Gardasil:   N/A HIV:  today Hep C:  today Screening Labs:   Urine today: 2+RBCs - see LMP.    reports that she has never smoked. She has never used smokeless tobacco. She reports that she does not drink alcohol or use drugs.  Past Medical History:  Diagnosis Date  . 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 GERD (gastroesophageal reflux disease)     Past Surgical History:  Procedure Laterality Date  . BREAST BIOPSY Right 01/2013    Current Outpatient Prescriptions  Medication Sig Dispense Refill  . acetaminophen (TYLENOL) 500 MG tablet Take 500 mg by mouth every 6 (six) hours as needed.    . COLLAGEN PO Take 1 Dose by mouth daily.    . Multiple Vitamins-Minerals (HAIR/SKIN/NAILS) TABS Take by mouth daily.    Marland Kitchen omeprazole (PRILOSEC) 20 MG capsule Take 1 capsule (20 mg total) by mouth daily. (Patient taking differently: Take 20  mg by mouth as needed. ) 30 capsule 11  . tranexamic acid (LYSTEDA) 650 MG TABS tablet Take 2 tablets (1,300 mg total) by mouth 3 (three) times daily. Take for 5 days during menses. 30 tablet 5   No current facility-administered medications for this visit.     Family History  Problem Relation Age of Onset  . Stroke Mother   . Diabetes Mother   . Heart disease Mother   . Hyperlipidemia Mother   . Hypertension Mother   . Cancer Father 60    liver  . Diabetes Brother     ROS:  Pertinent items are noted in HPI.  Otherwise, a comprehensive ROS was negative.  Exam:   BP 110/68 (BP Location: Right Arm, Patient Position: Bed low/side rails up, Cuff Size: Normal)   Pulse 80   Resp 16   Wt 150 lb (68 kg)   LMP 08/01/2016 (Exact Date)   BMI 24.96 kg/m     General appearance: alert, cooperative and appears stated age Head: Normocephalic, without obvious abnormality, atraumatic Neck: no adenopathy, supple, symmetrical, trachea midline and thyroid normal to inspection and palpation Lungs: clear to auscultation bilaterally Breasts: normal appearance - 7 mm raised dark nevus of right breast at 10:00, no masses or tenderness, No nipple retraction or dimpling, No nipple discharge or  bleeding, No axillary or supraclavicular adenopathy Heart: regular rate and rhythm Abdomen: soft, non-tender; no masses, no organomegaly Extremities: extremities normal, atraumatic, no cyanosis or edema Skin: Skin color, texture, turgor normal. No rashes or lesions Lymph nodes: Cervical, supraclavicular, and axillary nodes normal. No abnormal inguinal nodes palpated Neurologic: Grossly normal  Pelvic: External genitalia:  no lesions              Urethra:  normal appearing urethra with no masses, tenderness or lesions              Bartholins and Skenes: normal                 Vagina: normal appearing vagina with normal color and discharge, no lesions              Cervix: no lesions.  Mucousy drainage.               Pap taken: No. Bimanual Exam:  Uterus:  normal size, contour, position, consistency, mobility, non-tender              Adnexa: no mass, fullness, tenderness              Rectal exam: Yes.  .  Confirms.              Anus:  normal sphincter tone, no lesions  Chaperone was present for exam.  Assessment:   Well woman visit with normal exam. Desire for STD testing.  Back pain, positional.   Plan: Mammogram screening discussed.  [facility]  Recommended self breast awareness. Pap and HR HPV as above. Guidelines for Calcium, Vitamin D, regular exercise program including cardiovascular and weight bearing exercise. STD testing and routine labs. Try thermal patches. Follow up annually and prn.      After visit summary provided.

## 2016-08-06 NOTE — Patient Instructions (Signed)

## 2016-08-07 LAB — GC/CHLAMYDIA PROBE AMP
CT Probe RNA: NOT DETECTED
GC Probe RNA: NOT DETECTED

## 2016-08-07 LAB — STD PANEL
HEP B S AG: NEGATIVE
HIV: NONREACTIVE

## 2016-08-07 LAB — VITAMIN D 25 HYDROXY (VIT D DEFICIENCY, FRACTURES): Vit D, 25-Hydroxy: 22 ng/mL — ABNORMAL LOW (ref 30–100)

## 2016-08-07 LAB — HEPATITIS C ANTIBODY: HCV AB: NEGATIVE

## 2016-08-10 NOTE — Addendum Note (Signed)
Addended by: Ardell IsaacsAMUNDSON C SILVA, Debbe BalesBROOK E on: 08/10/2016 06:34 PM   Modules accepted: Orders

## 2016-08-11 ENCOUNTER — Telehealth: Payer: Self-pay | Admitting: *Deleted

## 2016-08-11 LAB — HEMOGLOBIN A1C
Hgb A1c MFr Bld: 5.3 % (ref <5.7)
Mean Plasma Glucose: 105 mg/dL

## 2016-08-11 LAB — T3, FREE: T3, Free: 2.9 pg/mL (ref 2.3–4.2)

## 2016-08-11 LAB — T4, FREE: FREE T4: 1.3 ng/dL (ref 0.8–1.8)

## 2016-08-11 MED ORDER — VITAMIN D (ERGOCALCIFEROL) 1.25 MG (50000 UNIT) PO CAPS: 50000.0000 [IU] | ORAL_CAPSULE | ORAL | 0 refills | Status: DC

## 2016-08-11 NOTE — Telephone Encounter (Signed)
-----   Message from Patton SallesBrook E Amundson C Silva, MD sent at 08/10/2016  6:34 PM EST ----- Please report lab results to patient: All STD testing is negative for infection.   This includes HIV, syphilis, hepatitis B and C, and gonorrhea and chlamydia.  Her vitamin D level is low at 22.  I am recommending vitamin D 50,000 IU every 2 weeks for 3 months.  Please send to her pharmacy of choice. She will need a lab recheck in 3 months, so please schedule this.  I will place a future order for the repeat Vit D level.  Her TSH is low, and I would like to have the lab add a free T3 and free T4.  Please communicate this to our lab tech.  Her cholesterol panel is showing slightly elevated triglycerides and a low HDL cholesterol  Her cholesterol ratios right now are ok.  A heart healthy diet is low in saturated fats and cholesterol.  Exercise can also lower these lipids.  Her glucose was a little elevated.  I would like to add a hemoglobin A1C to her labs already drawn to further assess her blood sugar control over the last few months. Please communicate this to our lab technician.   Her CBC was normal.   Cc- Claudette LawsAmanda Dixon

## 2016-08-11 NOTE — Telephone Encounter (Signed)
Patient returning your call.  Ok to leave detailed message °

## 2016-08-11 NOTE — Telephone Encounter (Signed)
Left message to call Noreene LarssonJill at [phone].  Notes Recorded by Leda MinJill N Cassy Sprowl, RN on 08/11/2016 at 11:20 AM EST Spoke with Tish, lab technician. Will add free T3, free T4 and hemoglobin A1C per Dr. Rica RecordsSilva's request as seen below.

## 2016-08-11 NOTE — Telephone Encounter (Signed)
Spoke with patient, advised of results and recommendations as seen below per Dr. Lenise HeraldSiva. Order placed for Vit D at verified pharmacy on file. Patient scheduled for 3 month Vit D recheck 11/26/16 at 0915, patient declined earlier appt date. Patient request copy of labs to be mailed address on file. Verbal ROI completed. Patient verbalizes understanding and is agreeable.  Routing to provider for final review. Patient is agreeable to disposition. Will close encounter.

## 2016-08-13 ENCOUNTER — Encounter: Payer: Self-pay | Admitting: Obstetrics and Gynecology

## 2016-08-13 NOTE — Addendum Note (Signed)
Addended by: Ardell IsaacsAMUNDSON C SILVA, Debbe BalesBROOK E on: 08/13/2016 07:54 PM   Modules accepted: Orders

## 2016-08-14 ENCOUNTER — Telehealth: Payer: Self-pay

## 2016-08-14 NOTE — Telephone Encounter (Signed)
Spoke with patient. Advised of message as seen below from Dr.Silva. Patient is agreeable and verbalizes understanding. Patient is scheduled for 3 month lab recheck for 11/26/2016 at 9:15 am. Aware she will have Vitamin D and thyroid panel with TSH rechecked at that time.  Routing to provider for final review. Patient agreeable to disposition. Will close encounter.

## 2016-08-14 NOTE — Telephone Encounter (Signed)
-----   Message from Patton SallesBrook E Amundson C Silva, MD sent at 08/13/2016  7:54 PM EST ----- Please report final free T3 and free T4 to patient which are normal, so her thyroid hormone levels are normal.  Her hemoglobin A1C is also normal, so she does not have diabetes.  Due to her low TSH level, I do recommend rechecking this in 3 months to see if she is developing overactive thyroid. Please make an appointment for a lab visit, and I will place the future order.

## 2016-08-14 NOTE — Telephone Encounter (Signed)
Opened in error

## 2016-08-15 ENCOUNTER — Other Ambulatory Visit: Payer: Self-pay | Admitting: Obstetrics and Gynecology

## 2016-08-15 DIAGNOSIS — Z1231 Encounter for screening mammogram for malignant neoplasm of breast: Secondary | ICD-10-CM

## 2016-10-09 ENCOUNTER — Ambulatory Visit: Payer: Self-pay

## 2016-11-06 ENCOUNTER — Other Ambulatory Visit: Payer: Self-pay | Admitting: Obstetrics and Gynecology

## 2016-11-15 ENCOUNTER — Telehealth: Payer: Self-pay | Admitting: Obstetrics and Gynecology

## 2016-11-15 NOTE — Telephone Encounter (Signed)
Please contact patient to return for her follow up lab visit.  She had a low TSH in February and normal T3 and T4 levels. I recommended rechecking this to see if she is developing hyperthyroidism. She cancelled her lab visit and did not reschedule it.

## 2016-11-18 NOTE — Telephone Encounter (Signed)
Spoke with patient. Patient states she is unable to have lab recheck with our office due to cost of labs. Patient states she is going to have these labs rechecked with her PCP to see if this will reduce cost. Will have lab work sent to Dr.Silva once complete.  Routing to provider for final review. Patient agreeable to disposition. Will close encounter.

## 2016-11-26 ENCOUNTER — Other Ambulatory Visit: Payer: Self-pay

## 2016-12-03 DIAGNOSIS — Z1231 Encounter for screening mammogram for malignant neoplasm of breast: Secondary | ICD-10-CM | POA: Diagnosis not present

## 2017-07-24 ENCOUNTER — Ambulatory Visit: Payer: BLUE CROSS/BLUE SHIELD | Admitting: Family Medicine

## 2017-07-24 ENCOUNTER — Encounter: Payer: Self-pay | Admitting: Family Medicine

## 2017-07-24 ENCOUNTER — Other Ambulatory Visit: Payer: Self-pay

## 2017-07-24 VITALS — BP 120/82 | HR 79 | Temp 98.6°F | Ht 65.0 in | Wt 141.0 lb

## 2017-07-24 DIAGNOSIS — K219 Gastro-esophageal reflux disease without esophagitis: Secondary | ICD-10-CM | POA: Diagnosis not present

## 2017-07-24 DIAGNOSIS — J069 Acute upper respiratory infection, unspecified: Secondary | ICD-10-CM

## 2017-07-24 MED ORDER — OMEPRAZOLE 20 MG PO CPDR: 20.0000 mg | DELAYED_RELEASE_CAPSULE | Freq: Every day | ORAL | 1 refills | Status: DC | PRN

## 2017-07-24 NOTE — Progress Notes (Signed)
2/1/201910:28 AM  Meghann C Wenberg 08/30/1970, 47 y.o. female [id number]  Chief Complaint  Patient presents with  . Cough    having chest congestion and body aches    HPI:   Patient is a 47 y.o. female  who presents today for worsening gerd in setting of recent cold. Cold symptoms better, still mild lingering cough with nasal congestion and right ear pressure. No sob, fever, chills, vomiting or diarrhea, abd pain. Doing ok with OTC cold meds. Requesting refill of omeprazole.  Depression screen PHQ 2/9 07/24/2017  Decreased Interest 0  Down, Depressed, Hopeless 0  PHQ - 2 Score 0    Allergies  Allergen Reactions  . Asa [Aspirin] Nausea And Vomiting  . Ibuprofen     REACTION: GI Upset    Prior to Admission medications   Medication Sig Start Date End Date Taking? Authorizing Provider  tranexamic acid (LYSTEDA) 650 MG TABS tablet Take 2 tablets (1,300 mg total) by mouth 3 (three) times daily. Take for 5 days during menses. 07/19/15  Yes Patton SallesAmundson C Silva, Brook E, MD  Vitamin D, Ergocalciferol, (DRISDOL) 50000 units CAPS capsule Take 1 capsule (50,000 Units total) by mouth every 14 (fourteen) days. 08/11/16   Patton SallesAmundson C Silva, Brook E, MD    Past Medical History:  Diagnosis Date  . 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 GERD (gastroesophageal reflux disease)   . Low TSH level 07/2016    Past Surgical History:  Procedure Laterality Date  . BREAST BIOPSY Right 01/2013    Social History   Tobacco Use  . Smoking status: Never Smoker  . Smokeless tobacco: Never Used  Substance Use Topics  . Alcohol use: No    Alcohol/week: 0.0 oz    Comment: rare social    Family History  Problem Relation Age of Onset  . Stroke Mother   . Diabetes Mother   . Heart disease Mother   . Hyperlipidemia Mother   . Hypertension Mother   . Cancer Father [id number]       liver  . Diabetes Brother     ROS Per hpi  OBJECTIVE:  Blood pressure 120/82, pulse 79, temperature 98.6 F (37  C), temperature source Oral, height 5\' 5"  (1.651 m), weight 141 lb (64 kg), SpO2 100 %.  Physical Exam  Constitutional: She is oriented to person, place, and time and well-developed, well-nourished, and in no distress.  HENT:  Head: Normocephalic and atraumatic.  Right Ear: Hearing, tympanic membrane, external ear and ear canal normal.  Left Ear: Hearing, tympanic membrane, external ear and ear canal normal.  Mouth/Throat: Oropharynx is clear and moist.  Eyes: EOM are normal. Pupils are equal, round, and reactive to light.  Neck: Neck supple.  Cardiovascular: Normal rate, regular rhythm and normal heart sounds. Exam reveals no gallop and no friction rub.  No murmur heard. Pulmonary/Chest: Effort normal and breath sounds normal. She has no wheezes. She has no rales.  Lymphadenopathy:    She has no cervical adenopathy.  Neurological: She is alert and oriented to person, place, and time. Gait normal.  Skin: Skin is warm and dry.     ASSESSMENT and PLAN  1. URI with cough and congestion  2. Gastroesophageal reflux disease, esophagitis presence not specified  Other orders - omeprazole (PRILOSEC) 20 MG capsule; Take 1 capsule (20 mg total) by mouth daily as needed.  Return if symptoms worsen or fail to improve.    Myles LippsIrma M Santiago, MD [facility] [address], [place] [address] Ph.  [phone] Fax [phone]

## 2017-07-24 NOTE — Patient Instructions (Signed)
     IF you received an x-ray today, you will receive an invoice from [facility]. Please contact [facility] at [phone] with questions or concerns regarding your invoice.   IF you received labwork today, you will receive an invoice from [facility]. Please contact [facility] at [phone] with questions or concerns regarding your invoice.   Our billing staff will not be able to assist you with questions regarding bills from these companies.  You will be contacted with the lab results as soon as they are available. The fastest way to get your results is to activate your My Chart account. Instructions are located on the last page of this paperwork. If you have not heard from us regarding the results in 2 weeks, please contact this office.     

## 2017-09-27 ENCOUNTER — Other Ambulatory Visit: Payer: Self-pay | Admitting: Family Medicine

## 2017-10-02 ENCOUNTER — Other Ambulatory Visit: Payer: Self-pay

## 2017-10-02 ENCOUNTER — Encounter: Payer: Self-pay | Admitting: Physician Assistant

## 2017-10-02 ENCOUNTER — Ambulatory Visit (INDEPENDENT_AMBULATORY_CARE_PROVIDER_SITE_OTHER): Payer: BLUE CROSS/BLUE SHIELD | Admitting: Physician Assistant

## 2017-10-02 VITALS — BP 108/70 | HR 75 | Temp 98.1°F | Resp 18 | Ht 65.55 in | Wt 144.4 lb

## 2017-10-02 DIAGNOSIS — Z1329 Encounter for screening for other suspected endocrine disorder: Secondary | ICD-10-CM

## 2017-10-02 DIAGNOSIS — Z13220 Encounter for screening for lipoid disorders: Secondary | ICD-10-CM | POA: Diagnosis not present

## 2017-10-02 DIAGNOSIS — Z13228 Encounter for screening for other metabolic disorders: Secondary | ICD-10-CM | POA: Diagnosis not present

## 2017-10-02 DIAGNOSIS — Z8249 Family history of ischemic heart disease and other diseases of the circulatory system: Secondary | ICD-10-CM

## 2017-10-02 DIAGNOSIS — Z0000 Encounter for general adult medical examination without abnormal findings: Secondary | ICD-10-CM

## 2017-10-02 DIAGNOSIS — Z130 Encounter for screening for diseases of the blood and blood-forming organs and certain disorders involving the immune mechanism: Secondary | ICD-10-CM | POA: Diagnosis not present

## 2017-10-02 DIAGNOSIS — R12 Heartburn: Secondary | ICD-10-CM

## 2017-10-02 MED ORDER — RANITIDINE HCL 150 MG PO TABS: 150.0000 mg | ORAL_TABLET | Freq: Two times a day (BID) | ORAL | 1 refills | Status: DC

## 2017-10-02 NOTE — Patient Instructions (Addendum)
Vit D 4000U a day - this can be in combination with a Calcium supplement  Melatonin - can help decrease urination at night -  It may also help you sleep better  Unisom to help with travel in the airplane -     IF you received an x-ray today, you will receive an invoice from [facility]. Please contact [facility] at [phone] with questions or concerns regarding your invoice.   IF you received labwork today, you will receive an invoice from [facility]. Please contact [facility] at [phone] with questions or concerns regarding your invoice.   Our billing staff will not be able to assist you with questions regarding bills from these companies.  You will be contacted with the lab results as soon as they are available. The fastest way to get your results is to activate your My Chart account. Instructions are located on the last page of this paperwork. If you have not heard from [place] regarding the results in 2 weeks, please contact this office.    Health Maintenance, Female Adopting a healthy lifestyle and getting preventive care can go a long way to promote health and wellness. Talk with your health care provider about what schedule of regular examinations is right for you. This is a good chance for you to check in with your provider about disease prevention and staying healthy. In between checkups, there are plenty of things you can do on your own. Experts have done a lot of research about which lifestyle changes and preventive measures are most likely to keep you healthy. Ask your health care provider for more information. Weight and diet Eat a healthy diet  Be sure to include plenty of vegetables, fruits, low-fat dairy products, and lean protein.  Do not eat a lot of foods high in solid fats, added sugars, or salt.  Get regular exercise. This is one of the most important things you can do for your health. ? Most adults should exercise for at least 150 minutes each week.  The exercise should increase your heart rate and make you sweat (moderate-intensity exercise). ? Most adults should also do strengthening exercises at least twice a week. This is in addition to the moderate-intensity exercise.  Maintain a healthy weight  Body mass index (BMI) is a measurement that can be used to identify possible weight problems. It estimates body fat based on height and weight. Your health care provider can help determine your BMI and help you achieve or maintain a healthy weight.  For females 47 years of age and older: ? A BMI below 18.5 is considered underweight. ? A BMI of 18.5 to 24.9 is normal. ? A BMI of 25 to 29.9 is considered overweight. ? A BMI of 30 and above is considered obese.  Watch levels of cholesterol and blood lipids  You should start having your blood tested for lipids and cholesterol at 47 years of age, then have this test every 5 years.  You may need to have your cholesterol levels checked more often if: ? Your lipid or cholesterol levels are high. ? You are older than 47 years of age. ? You are at high risk for heart disease.  Cancer screening Lung Cancer  Lung cancer screening is recommended for adults 47-66 years old who are at high risk for lung cancer because of a history of smoking.  A yearly low-dose CT scan of the lungs is recommended for people who: ? Currently smoke. ? Have quit within the past 15  years. ? Have at least a 30-pack-year history of smoking. A pack year is smoking an average of one pack of cigarettes a day for 1 year.  Yearly screening should continue until it has been 15 years since you quit.  Yearly screening should stop if you develop a health problem that would prevent you from having lung cancer treatment.  Breast Cancer  Practice breast self-awareness. This means understanding how your breasts normally appear and feel.  It also means doing regular breast self-exams. Let your health care provider know about  any changes, no matter how small.  If you are in your 47s or 30s, you should have a clinical breast exam (CBE) by a health care provider every 1-3 years as part of a regular health exam.  If you are 47 or older, have a CBE every year. Also consider having a breast X-ray (mammogram) every year.  If you have a family history of breast cancer, talk to your health care provider about genetic screening.  If you are at high risk for breast cancer, talk to your health care provider about having an MRI and a mammogram every year.  Breast cancer gene (BRCA) assessment is recommended for women who have family members with BRCA-related cancers. BRCA-related cancers include: ? Breast. ? Ovarian. ? Tubal. ? Peritoneal cancers.  Results of the assessment will determine the need for genetic counseling and BRCA1 and BRCA2 testing.  Cervical Cancer Your health care provider may recommend that you be screened regularly for cancer of the pelvic organs (ovaries, uterus, and vagina). This screening involves a pelvic examination, including checking for microscopic changes to the surface of your cervix (Pap test). You may be encouraged to have this screening done every 3 years, beginning at age 47.  For women ages 47-65, health care providers may recommend pelvic exams and Pap testing every 3 years, or they may recommend the Pap and pelvic exam, combined with testing for human papilloma virus (HPV), every 5 years. Some types of HPV increase your risk of cervical cancer. Testing for HPV may also be done on women of any age with unclear Pap test results.  Other health care providers may not recommend any screening for nonpregnant women who are considered low risk for pelvic cancer and who do not have symptoms. Ask your health care provider if a screening pelvic exam is right for you.  If you have had past treatment for cervical cancer or a condition that could lead to cancer, you need Pap tests and screening for  cancer for at least 20 years after your treatment. If Pap tests have been discontinued, your risk factors (such as having a new sexual partner) need to be reassessed to determine if screening should resume. Some women have medical problems that increase the chance of getting cervical cancer. In these cases, your health care provider may recommend more frequent screening and Pap tests.  Colorectal Cancer  This type of cancer can be detected and often prevented.  Routine colorectal cancer screening usually begins at 47 years of age and continues through 47 years of age.  Your health care provider may recommend screening at an earlier age if you have risk factors for colon cancer.  Your health care provider may also recommend using home test kits to check for hidden blood in the stool.  A small camera at the end of a tube can be used to examine your colon directly (sigmoidoscopy or colonoscopy). This is done to check for the earliest forms  of colorectal cancer.  Routine screening usually begins at age 24.  Direct examination of the colon should be repeated every 5-10 years through 47 years of age. However, you may need to be screened more often if early forms of precancerous polyps or small growths are found.  Skin Cancer  Check your skin from head to toe regularly.  Tell your health care provider about any new moles or changes in moles, especially if there is a change in a mole's shape or color.  Also tell your health care provider if you have a mole that is larger than the size of a pencil eraser.  Always use sunscreen. Apply sunscreen liberally and repeatedly throughout the day.  Protect yourself by wearing long sleeves, pants, a wide-brimmed hat, and sunglasses whenever you are outside.  Heart disease, diabetes, and high blood pressure  High blood pressure causes heart disease and increases the risk of stroke. High blood pressure is more likely to develop in: ? People who have blood  pressure in the high end of the normal range (130-139/85-89 mm Hg). ? People who are overweight or obese. ? People who are African American.  If you are 9-15 years of age, have your blood pressure checked every 3-5 years. If you are 85 years of age or older, have your blood pressure checked every year. You should have your blood pressure measured twice-once when you are at a hospital or clinic, and once when you are not at a hospital or clinic. Record the average of the two measurements. To check your blood pressure when you are not at a hospital or clinic, you can use: ? An automated blood pressure machine at a pharmacy. ? A home blood pressure monitor.  If you are between 45 years and 26 years old, ask your health care provider if you should take aspirin to prevent strokes.  Have regular diabetes screenings. This involves taking a blood sample to check your fasting blood sugar level. ? If you are at a normal weight and have a low risk for diabetes, have this test once every three years after 47 years of age. ? If you are overweight and have a high risk for diabetes, consider being tested at a younger age or more often. Preventing infection Hepatitis B  If you have a higher risk for hepatitis B, you should be screened for this virus. You are considered at high risk for hepatitis B if: ? You were born in a country where hepatitis B is common. Ask your health care provider which countries are considered high risk. ? Your parents were born in a high-risk country, and you have not been immunized against hepatitis B (hepatitis B vaccine). ? You have HIV or AIDS. ? You use needles to inject street drugs. ? You live with someone who has hepatitis B. ? You have had sex with someone who has hepatitis B. ? You get hemodialysis treatment. ? You take certain medicines for conditions, including cancer, organ transplantation, and autoimmune conditions.  Hepatitis C  Blood testing is recommended  for: ? Everyone born from [birth year] through 1965. ? Anyone with known risk factors for hepatitis C.  Sexually transmitted infections (STIs)  You should be screened for sexually transmitted infections (STIs) including gonorrhea and chlamydia if: ? You are sexually active and are younger than 47 years of age. ? You are older than 47 years of age and your health care provider tells you that you are at risk for this type of infection. ?  Your sexual activity has changed since you were last screened and you are at an increased risk for chlamydia or gonorrhea. Ask your health care provider if you are at risk.  If you do not have HIV, but are at risk, it may be recommended that you take a prescription medicine daily to prevent HIV infection. This is called pre-exposure prophylaxis (PrEP). You are considered at risk if: ? You are sexually active and do not regularly use condoms or know the HIV status of your partner(s). ? You take drugs by injection. ? You are sexually active with a partner who has HIV.  Talk with your health care provider about whether you are at high risk of being infected with HIV. If you choose to begin PrEP, you should first be tested for HIV. You should then be tested every 3 months for as long as you are taking PrEP. Pregnancy  If you are premenopausal and you may become pregnant, ask your health care provider about preconception counseling.  If you may become pregnant, take 400 to 800 micrograms (mcg) of folic acid every day.  If you want to prevent pregnancy, talk to your health care provider about birth control (contraception). Osteoporosis and menopause  Osteoporosis is a disease in which the bones lose minerals and strength with aging. This can result in serious bone fractures. Your risk for osteoporosis can be identified using a bone density scan.  If you are 30 years of age or older, or if you are at risk for osteoporosis and fractures, ask your health care provider if  you should be screened.  Ask your health care provider whether you should take a calcium or vitamin D supplement to lower your risk for osteoporosis.  Menopause may have certain physical symptoms and risks.  Hormone replacement therapy may reduce some of these symptoms and risks. Talk to your health care provider about whether hormone replacement therapy is right for you. Follow these instructions at home:  Schedule regular health, dental, and eye exams.  Stay current with your immunizations.  Do not use any tobacco products including cigarettes, chewing tobacco, or electronic cigarettes.  If you are pregnant, do not drink alcohol.  If you are breastfeeding, limit how much and how often you drink alcohol.  Limit alcohol intake to no more than 1 drink per day for nonpregnant women. One drink equals 12 ounces of beer, 5 ounces of wine, or 1 ounces of hard liquor.  Do not use street drugs.  Do not share needles.  Ask your health care provider for help if you need support or information about quitting drugs.  Tell your health care provider if you often feel depressed.  Tell your health care provider if you have ever been abused or do not feel safe at home. This information is not intended to replace advice given to you by your health care provider. Make sure you discuss any questions you have with your health care provider. Document Released: 12/23/2010 Document Revised: 11/15/2015 Document Reviewed: 03/13/2015 Elsevier Interactive Patient Education  [employer].

## 2017-10-02 NOTE — Progress Notes (Signed)
Karen Thornton  MRN: [id number] DOB: 11-16-1970  PCP: Mancel Bale, PA-C  Subjective:  Pt presents to clinic for a CPE.  Last dental exam:  Every 6 months Last vision exam: about 2 years ago Last pap: last year - With Dr Karen Thornton Last mammo: last year  Vaccinations - UTD  Going to [place] in May - she has an appt Monday at [facility] for travel immunization - she does not have record of these vaccines but he remembers a lot of them  She has some heartburn symptoms that she uses prilosec for that has helped but she is not sure how she should use it.  She gets symptoms with certain foods -  Like spicy or greasy foods -   Typical meals for patient: 3 meals - though sometimes not well timed at work - home cooked meals Typical beverage choices: water Exercises: 7 times per week for 15 minutes Sleeps: 4-6 hrs per night and it is currently disrupted  Patient Active Problem List   Diagnosis Date Noted  . Menorrhagia 11/11/2013  . Enlarged uterus 11/11/2013  . Abnormal mammogram with microcalcification 01/20/2013  . GERD 01/29/2009  . POSITIVE PPD 02/12/2007    Review of Systems  Constitutional: Negative.   HENT: Negative.   Eyes: Negative.   Respiratory: Negative.   Cardiovascular: Negative.   Gastrointestinal: Negative.   Endocrine: Negative.   Genitourinary: Negative.   Musculoskeletal: Negative.   Skin: Negative.   Allergic/Immunologic: Negative.   Neurological: Negative.   Hematological: Negative.   Psychiatric/Behavioral: Negative.      Current Outpatient Medications on File Prior to Visit  Medication Sig Dispense Refill  . omeprazole (PRILOSEC) 20 MG capsule TAKE 1 CAPSULE (20 MG TOTAL) BY MOUTH DAILY AS NEEDED. 30 capsule 1  . tranexamic acid (LYSTEDA) 650 MG TABS tablet Take 2 tablets (1,300 mg total) by mouth 3 (three) times daily. Take for 5 days during menses. 30 tablet 5  . Vitamin D, Ergocalciferol, (DRISDOL) 50000 units CAPS capsule Take 1 capsule  (50,000 Units total) by mouth every 14 (fourteen) days. 6 capsule 0   No current facility-administered medications on file prior to visit.     Allergies  Allergen Reactions  . Asa [Aspirin] Nausea And Vomiting  . Ibuprofen     REACTION: GI Upset    Social History   Socioeconomic History  . Marital status: Divorced    Spouse name: Not on file  . Number of children: 3  . Years of education: Not on file  . Highest education level: Not on file  Occupational History  . Occupation: Scientist, forensic  Social Needs  . Financial resource strain: Not on file  . Food insecurity:    Worry: Not on file    Inability: Not on file  . Transportation needs:    Medical: Not on file    Non-medical: Not on file  Tobacco Use  . Smoking status: Never Smoker  . Smokeless tobacco: Never Used  Substance and Sexual Activity  . Alcohol use: No    Alcohol/week: 0.0 oz    Comment: rare social  . Drug use: No  . Sexual activity: Yes    Partners: Male    Birth control/protection: None, Rhythm  Lifestyle  . Physical activity:    Days per week: Not on file    Minutes per session: Not on file  . Stress: Not on file  Relationships  . Social connections:    Talks on phone: Not on  file    Gets together: Not on file    Attends religious service: Not on file    Active member of club or organization: Not on file    Attends meetings of clubs or organizations: Not on file    Relationship status: Not on file  Other Topics Concern  . Not on file  Social History Narrative   Lives with boyfriend   Children in college   From [place] - moved to [place] in 2001    Past Surgical History:  Procedure Laterality Date  . BREAST BIOPSY Right 01/2013    Family History  Problem Relation Age of Onset  . Stroke Mother   . Diabetes Mother   . Heart disease Mother   . Hyperlipidemia Mother   . Hypertension Mother   . Cancer Father 49       liver  . Diabetes Brother      Objective:  BP 108/70   Pulse 75    Temp 98.1 F (36.7 C) (Oral)   Resp 18   Ht 5' 5.55" (1.665 m)   Wt 144 lb 6.4 oz (65.5 kg)   LMP 10/01/2017   SpO2 100%   BMI 23.63 kg/m   Physical Exam  Constitutional: She is oriented to person, place, and time.  HENT:  Head: Normocephalic and atraumatic.  Right Ear: Hearing, tympanic membrane, external ear and ear canal normal.  Left Ear: Hearing, tympanic membrane, external ear and ear canal normal.  Nose: Nose normal.  Mouth/Throat: Uvula is midline, oropharynx is clear and moist and mucous membranes are normal.  Eyes: Pupils are equal, round, and reactive to light. Conjunctivae and EOM are normal.  Neck: Trachea normal, normal range of motion and full passive range of motion without pain. Neck supple. No thyroid mass and no thyromegaly present.  Cardiovascular: Normal rate, regular rhythm and normal heart sounds.  No murmur heard. Pulmonary/Chest: Effort normal and breath sounds normal. She has no wheezes.  Abdominal: Soft. Bowel sounds are normal. There is no tenderness.  Musculoskeletal: Normal range of motion.  Lymphadenopathy:    She has no cervical adenopathy.  Neurological: She is alert and oriented to person, place, and time. She has normal strength and normal reflexes.  Skin: Skin is warm and dry.  Psychiatric: She has a normal mood and affect. Her behavior is normal. Judgment and thought content normal.    Wt Readings from Last 3 Encounters:  10/02/17 144 lb 6.4 oz (65.5 kg)  07/24/17 141 lb (64 kg)  08/06/16 150 lb (68 kg)     Visual Acuity Screening   Right eye Left eye Both eyes  Without correction: 20/30 20/15 20/13-1  With correction:       Assessment and Plan :  Annual physical exam  Heartburn - Plan: ranitidine (ZANTAC) 150 MG tablet - pt to change to this as she wants to use prn for foods that create heartburn for her - if this does not work she will f/u with me and she may need a higher PPI dose but I will want to maybe check H pylori 1st if  her symptoms are worsening but currently they are reactive symptoms  Screening for deficiency anemia - Plan: CBC with Differential/Platelet  Screening for metabolic disorder - Plan: CMP14+EGFR  Screening, lipid - Plan: Lipid panel  Family history of cardiac disorder  Screening for thyroid disorder - Plan: TSH, T4, Free  Windell Hummingbird PA-C  [facility] 10/02/2017 9:11 AM

## 2017-10-03 LAB — CBC WITH DIFFERENTIAL/PLATELET
BASOS ABS: 0 10*3/uL (ref 0.0–0.2)
Basos: 0 %
EOS (ABSOLUTE): 0.1 10*3/uL (ref 0.0–0.4)
Eos: 1 %
Hematocrit: 39.9 % (ref 34.0–46.6)
Hemoglobin: 12.9 g/dL (ref 11.1–15.9)
IMMATURE GRANS (ABS): 0 10*3/uL (ref 0.0–0.1)
IMMATURE GRANULOCYTES: 0 %
LYMPHS: 26 %
Lymphocytes Absolute: 2.3 10*3/uL (ref 0.7–3.1)
MCH: 26.9 pg (ref 26.6–33.0)
MCHC: 32.3 g/dL (ref 31.5–35.7)
MCV: 83 fL (ref 79–97)
Monocytes Absolute: 0.9 10*3/uL (ref 0.1–0.9)
Monocytes: 10 %
NEUTROS PCT: 63 %
Neutrophils Absolute: 5.6 10*3/uL (ref 1.4–7.0)
PLATELETS: 353 10*3/uL (ref 150–379)
RBC: 4.8 x10E6/uL (ref 3.77–5.28)
RDW: 14.3 % (ref 12.3–15.4)
WBC: 8.8 10*3/uL (ref 3.4–10.8)

## 2017-10-03 LAB — CMP14+EGFR
ALT: 7 IU/L (ref 0–32)
AST: 17 IU/L (ref 0–40)
Albumin/Globulin Ratio: 1.3 (ref 1.2–2.2)
Albumin: 4.5 g/dL (ref 3.5–5.5)
Alkaline Phosphatase: 54 IU/L (ref 39–117)
BILIRUBIN TOTAL: 0.4 mg/dL (ref 0.0–1.2)
BUN/Creatinine Ratio: 14 (ref 9–23)
BUN: 11 mg/dL (ref 6–24)
CALCIUM: 9.5 mg/dL (ref 8.7–10.2)
CHLORIDE: 99 mmol/L (ref 96–106)
CO2: 26 mmol/L (ref 20–29)
Creatinine, Ser: 0.76 mg/dL (ref 0.57–1.00)
GFR, EST AFRICAN AMERICAN: 108 mL/min/{1.73_m2} (ref >59)
GFR, EST NON AFRICAN AMERICAN: 94 mL/min/{1.73_m2} (ref >59)
GLUCOSE: 90 mg/dL (ref 65–99)
Globulin, Total: 3.4 g/dL (ref 1.5–4.5)
Potassium: 4.7 mmol/L (ref 3.5–5.2)
Sodium: 140 mmol/L (ref 134–144)
TOTAL PROTEIN: 7.9 g/dL (ref 6.0–8.5)

## 2017-10-03 LAB — LIPID PANEL
Chol/HDL Ratio: 3.6 ratio (ref 0.0–4.4)
Cholesterol, Total: 135 mg/dL (ref 100–199)
HDL: 37 mg/dL — AB (ref >39)
LDL Calculated: 36 mg/dL (ref 0–99)
Triglycerides: 312 mg/dL — ABNORMAL HIGH (ref 0–149)
VLDL CHOLESTEROL CAL: 62 mg/dL — AB (ref 5–40)

## 2017-10-03 LAB — TSH: TSH: 1.01 u[IU]/mL (ref 0.450–4.500)

## 2017-10-03 LAB — T4, FREE: Free T4: 1.16 ng/dL (ref 0.82–1.77)

## 2017-10-09 ENCOUNTER — Encounter: Payer: Self-pay | Admitting: *Deleted

## 2017-11-21 ENCOUNTER — Other Ambulatory Visit: Payer: Self-pay | Admitting: Physician Assistant

## 2017-12-30 ENCOUNTER — Other Ambulatory Visit: Payer: Self-pay

## 2017-12-30 ENCOUNTER — Ambulatory Visit: Payer: BLUE CROSS/BLUE SHIELD | Admitting: Obstetrics and Gynecology

## 2017-12-30 ENCOUNTER — Encounter: Payer: Self-pay | Admitting: Obstetrics and Gynecology

## 2017-12-30 ENCOUNTER — Other Ambulatory Visit (HOSPITAL_COMMUNITY)
Admission: RE | Admit: 2017-12-30 | Discharge: 2017-12-30 | Disposition: A | Discharge status: Home / Self Care | Payer: BLUE CROSS/BLUE SHIELD | Source: Ambulatory Visit | Attending: Obstetrics and Gynecology | Admitting: Obstetrics and Gynecology

## 2017-12-30 VITALS — BP 102/58 | HR 68 | Resp 16 | Ht 64.75 in | Wt 146.0 lb

## 2017-12-30 DIAGNOSIS — Z01419 Encounter for gynecological examination (general) (routine) without abnormal findings: Secondary | ICD-10-CM | POA: Insufficient documentation

## 2017-12-30 DIAGNOSIS — Z1211 Encounter for screening for malignant neoplasm of colon: Secondary | ICD-10-CM | POA: Diagnosis not present

## 2017-12-30 NOTE — Progress Notes (Signed)
47 y.o. [id number] Divorced Asian female here for annual exam.    No problems with menses.   Taking vit D 3 once daily.   PCP: Benny Lennert, PA-C    Patient's last menstrual period was 12/04/2017.           Sexually active: Yes.    The current method of family planning is none.    Exercising: Yes.    running Smoker:  no  Health Maintenance: Pap:  01-24-15 Neg:Neg HR HPV, 11-30-12 Neg:Neg HR HPV History of abnormal Pap:  no MMG:  12/03/16 BIRADS 1 negative.  Cat D density.  Will do at [facility] next year.  Colonoscopy:  N/a.   BMD:   n/a TDaP:  11/30/12 Gardasil:   no HIV and Hep C: 08/06/16 negative Screening Labs:  PCP   reports that she has never smoked. She has never used smokeless tobacco. She reports that she does not drink alcohol or use drugs.  Past Medical History:  Diagnosis Date  . 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 GERD (gastroesophageal reflux disease)   . Hypertriglyceridemia   . Low TSH level 07/2016    Past Surgical History:  Procedure Laterality Date  . BREAST BIOPSY Right 01/2013    Current Outpatient Medications  Medication Sig Dispense Refill  . ranitidine (ZANTAC) 150 MG tablet Take 1 tablet (150 mg total) by mouth 2 (two) times daily. 180 tablet 1  . Vitamin D, Ergocalciferol, (DRISDOL) 50000 units CAPS capsule Take 1 capsule (50,000 Units total) by mouth every 14 (fourteen) days. 6 capsule 0  . omeprazole (PRILOSEC) 20 MG capsule TAKE 1 CAPSULE (20 MG TOTAL) BY MOUTH DAILY AS NEEDED. (Patient not taking: Reported on 12/30/2017) 30 capsule 1   No current facility-administered medications for this visit.     Family History  Problem Relation Age of Onset  . Stroke Mother   . Diabetes Mother   . Heart disease Mother   . Hyperlipidemia Mother   . Hypertension Mother   . Cancer Father 54       liver  . Diabetes Brother     Review of Systems  Constitutional: Negative.   HENT: Negative.   Eyes: Negative.   Respiratory:  Negative.   Cardiovascular: Negative.   Gastrointestinal: Negative.   Endocrine: Negative.   Genitourinary: Negative.   Musculoskeletal: Negative.   Skin: Negative.   Allergic/Immunologic: Negative.   Neurological: Negative.   Hematological: Negative.   Psychiatric/Behavioral: Negative.     Exam:   BP (!) 102/58 (BP Location: Right Arm, Patient Position: Sitting, Cuff Size: Normal)   Pulse 68   Resp 16   Ht 5' 4.75" (1.645 m)   Wt 146 lb (66.2 kg)   LMP 12/04/2017   BMI 24.48 kg/m     General appearance: alert, cooperative and appears stated age Head: Normocephalic, without obvious abnormality, atraumatic Neck: no adenopathy, supple, symmetrical, trachea midline and thyroid normal to inspection and palpation Lungs: clear to auscultation bilaterally Breasts: normal appearance, no masses or tenderness, No nipple retraction or dimpling, No nipple discharge or bleeding, No axillary or supraclavicular adenopathy Heart: regular rate and rhythm Abdomen: soft, non-tender; no masses, no organomegaly Extremities: extremities normal, atraumatic, no cyanosis or edema Skin: Skin color, texture, turgor normal. No rashes or lesions Lymph nodes: Cervical, supraclavicular, and axillary nodes normal. No abnormal inguinal nodes palpated Neurologic: Grossly normal  Pelvic: External genitalia:  no lesions  Urethra:  normal appearing urethra with no masses, tenderness or lesions              Bartholins and Skenes: normal                 Vagina: normal appearing vagina with normal color and discharge, no lesions              Cervix: no lesions              Pap taken: Yes.   Bimanual Exam:  Uterus:  normal size, contour, position, consistency, mobility, non-tender              Adnexa: no mass, fullness, tenderness              Rectal exam: Yes.  .  Confirms.              Anus:  normal sphincter tone, no lesions  Chaperone was present for exam.  Assessment:   Well woman visit with  normal exam. Hx low vit D.   Plan: Mammogram screening.  I discussed 3D.  She will schedule with [facility].  Recommended self breast awareness. Pap and HR HPV as above. Guidelines for Calcium, Vitamin D, regular exercise program including cardiovascular and weight bearing exercise. IFOB. She will recheck her vit D through her PCP.   Follow up annually and prn.   After visit summary provided.

## 2017-12-30 NOTE — Patient Instructions (Signed)

## 2018-01-01 LAB — CYTOLOGY - PAP
Diagnosis: NEGATIVE
HPV (WINDOPATH): NOT DETECTED

## 2019-01-12 ENCOUNTER — Ambulatory Visit: Payer: BLUE CROSS/BLUE SHIELD | Admitting: Obstetrics and Gynecology

## 2019-03-30 ENCOUNTER — Encounter: Payer: BLUE CROSS/BLUE SHIELD | Admitting: Adult Health Nurse Practitioner

## 2019-04-29 ENCOUNTER — Encounter: Payer: BLUE CROSS/BLUE SHIELD | Admitting: Adult Health Nurse Practitioner

## 2019-05-05 ENCOUNTER — Other Ambulatory Visit: Payer: Self-pay

## 2019-05-05 ENCOUNTER — Telehealth (INDEPENDENT_AMBULATORY_CARE_PROVIDER_SITE_OTHER): Payer: BC Managed Care – PPO | Admitting: Adult Health Nurse Practitioner

## 2019-05-05 ENCOUNTER — Encounter: Payer: Self-pay | Admitting: Adult Health Nurse Practitioner

## 2019-05-05 VITALS — BP 137/82 | HR 64 | Temp 98.1°F | Resp 12 | Ht 65.0 in | Wt 148.0 lb

## 2019-05-05 DIAGNOSIS — Z1231 Encounter for screening mammogram for malignant neoplasm of breast: Secondary | ICD-10-CM | POA: Diagnosis not present

## 2019-05-05 DIAGNOSIS — Z0000 Encounter for general adult medical examination without abnormal findings: Secondary | ICD-10-CM | POA: Diagnosis not present

## 2019-05-05 MED ORDER — RANITIDINE HCL 150 MG PO CAPS: 150.0000 mg | ORAL_CAPSULE | Freq: Two times a day (BID) | ORAL | 0 refills | Status: DC

## 2019-05-05 MED ORDER — METHOCARBAMOL 500 MG PO TABS: 500.0000 mg | ORAL_TABLET | Freq: Four times a day (QID) | ORAL | 0 refills | Status: DC

## 2019-05-05 MED ORDER — METHYLPREDNISOLONE 4 MG PO TBPK: ORAL_TABLET | ORAL | 0 refills | Status: DC

## 2019-05-06 LAB — COMPREHENSIVE METABOLIC PANEL
ALT: 7 IU/L (ref 0–32)
AST: 19 IU/L (ref 0–40)
Albumin/Globulin Ratio: 1.3 (ref 1.2–2.2)
Albumin: 4.1 g/dL (ref 3.8–4.8)
Alkaline Phosphatase: 49 IU/L (ref 39–117)
BUN/Creatinine Ratio: 16 (ref 9–23)
BUN: 12 mg/dL (ref 6–24)
Bilirubin Total: 0.4 mg/dL (ref 0.0–1.2)
CO2: 24 mmol/L (ref 20–29)
Calcium: 9.3 mg/dL (ref 8.7–10.2)
Chloride: 104 mmol/L (ref 96–106)
Creatinine, Ser: 0.74 mg/dL (ref 0.57–1.00)
GFR calc Af Amer: 111 mL/min/{1.73_m2} (ref >59)
GFR calc non Af Amer: 96 mL/min/{1.73_m2} (ref >59)
Globulin, Total: 3.2 g/dL (ref 1.5–4.5)
Glucose: 98 mg/dL (ref 65–99)
Potassium: 4.1 mmol/L (ref 3.5–5.2)
Sodium: 141 mmol/L (ref 134–144)
Total Protein: 7.3 g/dL (ref 6.0–8.5)

## 2019-05-06 LAB — LIPID PANEL
Chol/HDL Ratio: 3.4 ratio (ref 0.0–4.4)
Cholesterol, Total: 154 mg/dL (ref 100–199)
HDL: 45 mg/dL (ref >39)
LDL Chol Calc (NIH): 87 mg/dL (ref 0–99)
Triglycerides: 122 mg/dL (ref 0–149)
VLDL Cholesterol Cal: 22 mg/dL (ref 5–40)

## 2019-05-06 LAB — TSH: TSH: 0.859 u[IU]/mL (ref 0.450–4.500)

## 2019-05-06 LAB — HEMOGLOBIN A1C
Est. average glucose Bld gHb Est-mCnc: 108 mg/dL
Hgb A1c MFr Bld: 5.4 % (ref 4.8–5.6)

## 2019-05-06 NOTE — Patient Instructions (Signed)
Health Maintenance, Female Adopting a healthy lifestyle and getting preventive care are important in promoting health and wellness. Ask your health care provider about:  The right schedule for you to have regular tests and exams.  Things you can do on your own to prevent diseases and keep yourself healthy. What should I know about diet, weight, and exercise? Eat a healthy diet   Eat a diet that includes plenty of vegetables, fruits, low-fat dairy products, and lean protein.  Do not eat a lot of foods that are high in solid fats, added sugars, or sodium. Maintain a healthy weight Body mass index (BMI) is used to identify weight problems. It estimates body fat based on height and weight. Your health care provider can help determine your BMI and help you achieve or maintain a healthy weight. Get regular exercise Get regular exercise. This is one of the most important things you can do for your health. Most adults should:  Exercise for at least 150 minutes each week. The exercise should increase your heart rate and make you sweat (moderate-intensity exercise).  Do strengthening exercises at least twice a week. This is in addition to the moderate-intensity exercise.  Spend less time sitting. Even light physical activity can be beneficial. Watch cholesterol and blood lipids Have your blood tested for lipids and cholesterol at 48 years of age, then have this test every 5 years. Have your cholesterol levels checked more often if:  Your lipid or cholesterol levels are high.  You are older than 48 years of age.  You are at high risk for heart disease. What should I know about cancer screening? Depending on your health history and family history, you may need to have cancer screening at various ages. This may include screening for:  Breast cancer.  Cervical cancer.  Colorectal cancer.  Skin cancer.  Lung cancer. What should I know about heart disease, diabetes, and high blood  pressure? Blood pressure and heart disease  High blood pressure causes heart disease and increases the risk of stroke. This is more likely to develop in people who have high blood pressure readings, are of African descent, or are overweight.  Have your blood pressure checked: ? Every 3-5 years if you are 18-39 years of age. ? Every year if you are 40 years old or older. Diabetes Have regular diabetes screenings. This checks your fasting blood sugar level. Have the screening done:  Once every three years after age 40 if you are at a normal weight and have a low risk for diabetes.  More often and at a younger age if you are overweight or have a high risk for diabetes. What should I know about preventing infection? Hepatitis B If you have a higher risk for hepatitis B, you should be screened for this virus. Talk with your health care provider to find out if you are at risk for hepatitis B infection. Hepatitis C Testing is recommended for:  Everyone born from 1945 through 1965.  Anyone with known risk factors for hepatitis C. Sexually transmitted infections (STIs)  Get screened for STIs, including gonorrhea and chlamydia, if: ? You are sexually active and are younger than 48 years of age. ? You are older than 48 years of age and your health care provider tells you that you are at risk for this type of infection. ? Your sexual activity has changed since you were last screened, and you are at increased risk for chlamydia or gonorrhea. Ask your health care provider if   you are at risk.  Ask your health care provider about whether you are at high risk for HIV. Your health care provider may recommend a prescription medicine to help prevent HIV infection. If you choose to take medicine to prevent HIV, you should first get tested for HIV. You should then be tested every 3 months for as long as you are taking the medicine. Pregnancy  If you are about to stop having your period (premenopausal) and  you may become pregnant, seek counseling before you get pregnant.  Take 400 to 800 micrograms (mcg) of folic acid every day if you become pregnant.  Ask for birth control (contraception) if you want to prevent pregnancy. Osteoporosis and menopause Osteoporosis is a disease in which the bones lose minerals and strength with aging. This can result in bone fractures. If you are 65 years old or older, or if you are at risk for osteoporosis and fractures, ask your health care provider if you should:  Be screened for bone loss.  Take a calcium or vitamin D supplement to lower your risk of fractures.  Be given hormone replacement therapy (HRT) to treat symptoms of menopause. Follow these instructions at home: Lifestyle  Do not use any products that contain nicotine or tobacco, such as cigarettes, e-cigarettes, and chewing tobacco. If you need help quitting, ask your health care provider.  Do not use street drugs.  Do not share needles.  Ask your health care provider for help if you need support or information about quitting drugs. Alcohol use  Do not drink alcohol if: ? Your health care provider tells you not to drink. ? You are pregnant, may be pregnant, or are planning to become pregnant.  If you drink alcohol: ? Limit how much you use to 0-1 drink a day. ? Limit intake if you are breastfeeding.  Be aware of how much alcohol is in your drink. In the [place] one drink equals one 12 oz bottle of beer (355 mL), one 5 oz glass of wine (148 mL), or one 1 oz glass of hard liquor (44 mL). General instructions  Schedule regular health, dental, and eye exams.  Stay current with your vaccines.  Tell your health care provider if: ? You often feel depressed. ? You have ever been abused or do not feel safe at home. Summary  Adopting a healthy lifestyle and getting preventive care are important in promoting health and wellness.  Follow your health care provider's instructions about healthy  diet, exercising, and getting tested or screened for diseases.  Follow your health care provider's instructions on monitoring your cholesterol and blood pressure. This information is not intended to replace advice given to you by your health care provider. Make sure you discuss any questions you have with your health care provider. Document Released: 12/23/2010 Document Revised: 06/02/2018 Document Reviewed: 06/02/2018 Elsevier Patient Education  2020 [employer]  

## 2019-05-06 NOTE — Progress Notes (Signed)
Chief Complaint  Patient presents with  . Annual Exam    Pt stated--both hands having numness and cramping muscle ---2 months. 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  . Muscle Pain    pt stated ---having chest pain maybe pulling magnet --strong to clip together for the material.Pt decline to have the EKG due to having different insurance. Nofied pt if the symptoms is getting worse go to the ER.    HPI   Patient presents to establish care and for a new patient exam.  She reports that she started a new job in September.  Since then, has had increased chest wall pain as well as trapezus pain.  Started with the repetitive nature of her job.  Not associated with SOB or radiating arm pain.    Hx of Vitamin D deficiency untreated.  Currently having chest and back pain.    No other problems currently.     Review of Systems  Constitutional: Negative for activity change, appetite change, chills and fever.  HENT: Negative for congestion, nosebleeds, trouble swallowing and voice change.   Respiratory: Negative for cough, shortness of breath and wheezing.   Gastrointestinal: Negative for diarrhea, nausea and vomiting.  Genitourinary: Negative for difficulty urinating, dysuria, flank pain and hematuria.  Musculoskeletal: Negative for back pain, joint swelling and neck pain.  Neurological: Negative for dizziness, speech difficulty, light-headedness and numbness.  See HPI. All other review of systems negative.     OBJECTIVE: BP 137/82   Pulse 64   Temp 98.1 F (36.7 C)   Resp 12   Ht 5\' 5"  (1.651 m)   Wt 148 lb (67.1 kg)   SpO2 96%   BMI 24.63 kg/m   Appearance: alert, well appearing, and in no distress. General exam: CVS exam BP noted to be well controlled today in office, S1, S2 normal, no gallop, no murmur, chest clear, no JVD, no HSM, no edema. Lab review: labs reviewed, I note that Vitamin D is low from last visit .  Medications    Current Outpatient Medications  on File Prior to Visit  Medication Sig Dispense Refill  . Fish Oil-Cholecalciferol (OMEGA-3 + D PO) Take by mouth.    . ranitidine (ZANTAC) 150 MG tablet Take 1 tablet (150 mg total) by mouth 2 (two) times daily. 180 tablet 1  . Vitamin D, Ergocalciferol, (DRISDOL) 50000 units CAPS capsule Take 1 capsule (50,000 Units total) by mouth every 14 (fourteen) days. 6 capsule 0  . omeprazole (PRILOSEC) 20 MG capsule TAKE 1 CAPSULE (20 MG TOTAL) BY MOUTH DAILY AS NEEDED. (Patient not taking: Reported on 12/30/2017) 30 capsule 1   No current facility-administered medications on file prior to visit.     is allergic to asa [aspirin] and ibuprofen.    Problem List    Vitamin D deficiency GERD    Assessment & Plan:  Karen Thornton is a 48 y.o. female . Annual physical exam - Plan: Lipid panel, TSH, Hemoglobin A1c, CBC with Differential, Comprehensive metabolic panel Meds ordered this encounter  Medications  . ranitidine (ZANTAC) 150 MG capsule    Sig: Take 1 capsule (150 mg total) by mouth 2 (two) times daily.    Dispense:  120 capsule    Refill:  0  . methylPREDNISolone (MEDROL DOSEPAK) 4 MG TBPK tablet    Sig: As directed on pkg label    Dispense:  21 each    Refill:  0  . methocarbamol (ROBAXIN) 500 MG tablet  Sig: Take 1 tablet (500 mg total) by mouth 4 (four) times daily.    Dispense:  30 tablet    Refill:  0   There are no Patient Instructions on file for this visit.  Will f/u as needed.  Instructions given for Vitamin D.  Patient is inline wit this plan.   Elyse Jarvis, NP

## 2019-10-04 ENCOUNTER — Ambulatory Visit: Payer: BC Managed Care – PPO | Admitting: Obstetrics and Gynecology

## 2019-10-10 ENCOUNTER — Other Ambulatory Visit: Payer: Self-pay

## 2019-10-11 ENCOUNTER — Ambulatory Visit: Payer: 59 | Admitting: Obstetrics and Gynecology

## 2019-10-11 ENCOUNTER — Encounter: Payer: Self-pay | Admitting: Obstetrics and Gynecology

## 2019-10-11 VITALS — BP 102/60 | HR 78 | Temp 97.5°F | Ht 65.75 in | Wt 151.0 lb

## 2019-10-11 DIAGNOSIS — Z01419 Encounter for gynecological examination (general) (routine) without abnormal findings: Secondary | ICD-10-CM

## 2019-10-11 DIAGNOSIS — R350 Frequency of micturition: Secondary | ICD-10-CM

## 2019-10-11 NOTE — Progress Notes (Addendum)
49 y.o. [id number] Divorced Asian Not Hispanic or Latino female here for annual exam.  She would also like to talk about numbness in her hands. She says that she gets lower back pain after sleeping about 5 hours, she is also having leg cramps. She also feels pain with sex some of the time.  Sexually active, same long term partner. Just occasional dyspareunia.  Menses q 28-30  Period Cycle (Days): 28 Period Duration (Days): 4-5 Period Pattern: (!) Irregular Menstrual Flow: Moderate, Heavy Menstrual Control: Maxi pad Menstrual Control Change Freq (Hours): 3-4 Dysmenorrhea: None   She c/o urinary frequency, normal amounts, no urgency. States she can go hourly. No caffeine, no leakage.  Sometimes when she voids, she unexpectedly has a BM. BM 1-2 x a day.   Sometimes she gets pain in her upper left chest. No SOB.  Patient's last menstrual period was 09/28/2019 (approximate).          Sexually active: Yes.    The current method of family planning is none.    Exercising: Yes.    run and boxing twice a day Smoker:  no  Health Maintenance: Pap: 12-30-17 Neg:Neg HR HPV, 01-24-15 Neg:Neg HR HPV History of abnormal Pap:  no MMG:  12/03/16 Bi rads 1 neg (care everywhere)  Colonoscopy:  n/a BMD:   n/a TDaP:  11-30-12   reports that she has never smoked. She has never used smokeless tobacco. She reports that she does not drink alcohol or use drugs. She works as a Regulatory affairs officer. Son is 11, daughter is almost 29. Both live at home with covid. Both in school at [employer]. Son is getting a degree in Careers information officer, daughter is a Programme researcher, broadcasting/film/video.   Past Medical History:  Diagnosis Date  . 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 GERD (gastroesophageal reflux disease)   . Hypertriglyceridemia   . Low TSH level 07/2016    Past Surgical History:  Procedure Laterality Date  . BREAST BIOPSY Right 01/2013    Current Outpatient Medications  Medication Sig Dispense Refill  . Fiber POWD Take  by mouth.    . Fish Oil-Cholecalciferol (OMEGA-3 + D PO) Take by mouth.    . ranitidine (ZANTAC) 150 MG tablet Take 1 tablet (150 mg total) by mouth 2 (two) times daily. 180 tablet 1  . Vitamin D, Ergocalciferol, (DRISDOL) 50000 units CAPS capsule Take 1 capsule (50,000 Units total) by mouth every 14 (fourteen) days. 6 capsule 0   No current facility-administered medications for this visit.    Family History  Problem Relation Age of Onset  . Stroke Mother   . Diabetes Mother   . Heart disease Mother   . Hyperlipidemia Mother   . Hypertension Mother   . Cancer Father 43       liver  . Diabetes Brother     Review of Systems  Constitutional: Negative.   HENT: Negative.   Eyes: Negative.   Respiratory: Negative.   Cardiovascular: Negative.   Gastrointestinal: Positive for abdominal pain.  Endocrine: Negative.   Genitourinary: Negative.   Musculoskeletal: Negative.   Skin: Negative.   Allergic/Immunologic: Negative.   Neurological: Positive for numbness.  Hematological: Negative.   Psychiatric/Behavioral: Negative.     Exam:   BP 102/60   Pulse 78   Temp (!) 97.5 F (36.4 C)   Ht 5' 5.75" (1.67 m)   Wt 151 lb (68.5 kg)   LMP 09/28/2019 (Approximate)   SpO2 99%  BMI 24.56 kg/m   Weight change: @WEIGHTCHANGE @ Height:   Height: 5' 5.75" (167 cm)  Ht Readings from Last 3 Encounters:  10/11/19 5' 5.75" (1.67 m)  05/05/19 5\' 5"  (1.651 m)  12/30/17 5' 4.75" (1.645 m)    General appearance: alert, cooperative and appears stated age Head: Normocephalic, without obvious abnormality, atraumatic Neck: no adenopathy, supple, symmetrical, trachea midline and thyroid normal to inspection and palpation Lungs: clear to auscultation bilaterally Cardiovascular: regular rate and rhythm Breasts: normal appearance, no masses or tenderness Abdomen: soft, non-tender; non distended,  no masses,  no organomegaly Extremities: extremities normal, atraumatic, no cyanosis or edema Skin:  Skin color, texture, turgor normal. No rashes or lesions Lymph nodes: Cervical, supraclavicular, and axillary nodes normal. No abnormal inguinal nodes palpated Neurologic: Grossly normal   Pelvic: External genitalia:  no lesions              Urethra:  normal appearing urethra with no masses, tenderness or lesions              Bartholins and Skenes: normal                 Vagina: normal appearing vagina with normal color and discharge, no lesions              Cervix: no lesions               Bimanual Exam:  Uterus:  normal size, contour, position, consistency, mobility, non-tender. Suspect she may have a small myoma off the anterior right fundus              Adnexa: no mass, fullness, tenderness               Rectovaginal: Confirms               Anus:  normal sphincter tone, no lesions  chaperoned for the exam.  A:  Well Woman with normal exam  Urinary frequency    P:   No pap this year  Labs UTD  Names of primary MD given  Check urine dip  Mammogram overdue she will schedule  Discussed breast self exam  Discussed calcium and vit D intake   Addendum: urine dip with moderate blood and 2 + leuk. Will send for ua, c&s

## 2019-10-11 NOTE — Patient Instructions (Addendum)
EXERCISE AND DIET:  We recommended that you start or continue a regular exercise program for good health. Regular exercise means any activity that makes your heart beat faster and makes you sweat.  We recommend exercising at least 30 minutes per day at least 3 days a week, preferably 4 or 5.  We also recommend a diet low in fat and sugar.  Inactivity, poor dietary choices and obesity can cause diabetes, heart attack, stroke, and kidney damage, among others.    ALCOHOL AND SMOKING:  Women should limit their alcohol intake to no more than 7 drinks/beers/glasses of wine (combined, not each!) per week. Moderation of alcohol intake to this level decreases your risk of breast cancer and liver damage. And of course, no recreational drugs are part of a healthy lifestyle.  And absolutely no smoking or even second hand smoke. Most people know smoking can cause heart and lung diseases, but did you know it also contributes to weakening of your bones? Aging of your skin?  Yellowing of your teeth and nails?  CALCIUM AND VITAMIN D:  Adequate intake of calcium and Vitamin D are recommended.  The recommendations for exact amounts of these supplements seem to change often, but generally speaking 1,000 mg of calcium (between diet and supplement) and 1,000 units of Vitamin D per day seems prudent. Certain women may benefit from higher intake of Vitamin D.  If you are among these women, your doctor will have told you during your visit.    PAP SMEARS:  Pap smears, to check for cervical cancer or precancers,  have traditionally been done yearly, although recent scientific advances have shown that most women can have pap smears less often.  However, every woman still should have a physical exam from her gynecologist every year. It will include a breast check, inspection of the vulva and vagina to check for abnormal growths or skin changes, a visual exam of the cervix, and then an exam to evaluate the size and shape of the uterus and  ovaries.  And after 49 years of age, a rectal exam is indicated to check for rectal cancers. We will also provide age appropriate advice regarding health maintenance, like when you should have certain vaccines, screening for sexually transmitted diseases, bone density testing, colonoscopy, mammograms, etc.   MAMMOGRAMS:  All women over 3 years old should have a yearly mammogram. Many facilities now offer a "3D" mammogram, which may cost around $50 extra out of pocket. If possible,  we recommend you accept the option to have the 3D mammogram performed.  It both reduces the number of women who will be called back for extra views which then turn out to be normal, and it is better than the routine mammogram at detecting truly abnormal areas.    COLON CANCER SCREENING: Now recommend starting at age 56. At this time colonoscopy is not covered for routine screening until 50. There are take home tests that can be done between 45-49.   COLONOSCOPY:  Colonoscopy to screen for colon cancer is recommended for all women at age 33.  We know, you hate the idea of the prep.  We agree, BUT, having colon cancer and not knowing it is worse!!  Colon cancer so often starts as a polyp that can be seen and removed at colonscopy, which can quite literally save your life!  And if your first colonoscopy is normal and you have no family history of colon cancer, most women don't have to have it again for  10 years.  Once every ten years, you can do something that may end up saving your life, right?  We will be happy to help you get it scheduled when you are ready.  Be sure to check your insurance coverage so you understand how much it will cost.  It may be covered as a preventative service at no cost, but you should check your particular policy.      Breast Self-Awareness Breast self-awareness means being familiar with how your breasts look and feel. It involves checking your breasts regularly and reporting any changes to your  health care provider. Practicing breast self-awareness is important. A change in your breasts can be a sign of a serious medical problem. Being familiar with how your breasts look and feel allows you to find any problems early, when treatment is more likely to be successful. All women should practice breast self-awareness, including women who have had breast implants. How to do a breast self-exam One way to learn what is normal for your breasts and whether your breasts are changing is to do a breast self-exam. To do a breast self-exam: Look for Changes  1. Remove all the clothing above your waist. 2. Stand in front of a mirror in a room with good lighting. 3. Put your hands on your hips. 4. Push your hands firmly downward. 5. Compare your breasts in the mirror. Look for differences between them (asymmetry), such as: ? Differences in shape. ? Differences in size. ? Puckers, dips, and bumps in one breast and not the other. 6. Look at each breast for changes in your skin, such as: ? Redness. ? Scaly areas. 7. Look for changes in your nipples, such as: ? Discharge. ? Bleeding. ? Dimpling. ? Redness. ? A change in position. Feel for Changes Carefully feel your breasts for lumps and changes. It is best to do this while lying on your back on the floor and again while sitting or standing in the shower or tub with soapy water on your skin. Feel each breast in the following way:  Place the arm on the side of the breast you are examining above your head.  Feel your breast with the other hand.  Start in the nipple area and make  inch (2 cm) overlapping circles to feel your breast. Use the pads of your three middle fingers to do this. Apply light pressure, then medium pressure, then firm pressure. The light pressure will allow you to feel the tissue closest to the skin. The medium pressure will allow you to feel the tissue that is a little deeper. The firm pressure will allow you to feel the tissue  close to the ribs.  Continue the overlapping circles, moving downward over the breast until you feel your ribs below your breast.  Move one finger-width toward the center of the body. Continue to use the  inch (2 cm) overlapping circles to feel your breast as you move slowly up toward your collarbone.  Continue the up and down exam using all three pressures until you reach your armpit.  Write Down What You Find  Write down what is normal for each breast and any changes that you find. Keep a written record with breast changes or normal findings for each breast. By writing this information down, you do not need to depend only on memory for size, tenderness, or location. Write down where you are in your menstrual cycle, if you are still menstruating. If you are having trouble noticing differences   in your breasts, do not get discouraged. With time you will become more familiar with the variations in your breasts and more comfortable with the exam. How often should I examine my breasts? Examine your breasts every month. If you are breastfeeding, the best time to examine your breasts is after a feeding or after using a breast pump. If you menstruate, the best time to examine your breasts is 5-7 days after your period is over. During your period, your breasts are lumpier, and it may be more difficult to notice changes. When should I see my health care provider? See your health care provider if you notice:  A change in shape or size of your breasts or nipples.  A change in the skin of your breast or nipples, such as a reddened or scaly area.  Unusual discharge from your nipples.  A lump or thick area that was not there before.  Pain in your breasts.  Anything that concerns you.  Leg Cramps Leg cramps occur when one or more muscles tighten and you have no control over this tightening (involuntary muscle contraction). Muscle cramps can develop in any muscle, but the most common place is in the  calf muscles of the leg. Those cramps can occur during exercise or when you are at rest. Leg cramps are painful, and they may last for a few seconds to a few minutes. Cramps may return several times before they finally stop. Usually, leg cramps are not caused by a serious medical problem. In many cases, the cause is not known. Some common causes include:  Excessive physical effort (overexertion), such as during intense exercise.  Overuse from repetitive motions, or doing the same thing over and over.  Staying in a certain position for a long period of time.  Improper preparation, form, or technique while performing a sport or an activity.  Dehydration.  Injury.  Side effects of certain medicines.  Abnormally low levels of minerals in your blood (electrolytes), especially potassium and calcium. This could result from: ? Pregnancy. ? Taking diuretic medicines. Follow these instructions at home: Eating and drinking  Drink enough fluid to keep your urine pale yellow. Staying hydrated may help prevent cramps.  Eat a healthy diet that includes plenty of nutrients to help your muscles function. A healthy diet includes fruits and vegetables, lean protein, whole grains, and low-fat or nonfat dairy products. Managing pain, stiffness, and swelling      Try massaging, stretching, and relaxing the affected muscle. Do this for several minutes at a time.  If directed, put ice on areas that are sore or painful after a cramp: ? Put ice in a plastic bag. ? Place a towel between your skin and the bag. ? Leave the ice on for 20 minutes, 2-3 times a day.  If directed, apply heat to muscles that are tense or tight. Do this before you exercise, or as often as told by your health care provider. Use the heat source that your health care provider recommends, such as a moist heat pack or a heating pad. ? Place a towel between your skin and the heat source. ? Leave the heat on for 20-30  minutes. ? Remove the heat if your skin turns bright red. This is especially important if you are unable to feel pain, heat, or cold. You may have a greater risk of getting burned.  Try taking hot showers or baths to help relax tight muscles. General instructions  If you are having frequent leg cramps,  avoid intense exercise for several days.  Take over-the-counter and prescription medicines only as told by your health care provider.  Keep all follow-up visits as told by your health care provider. This is important. Contact a health care provider if:  Your leg cramps get more severe or more frequent, or they do not improve over time.  Your foot becomes cold, numb, or blue. Summary  Muscle cramps can develop in any muscle, but the most common place is in the calf muscles of the leg.  Leg cramps are painful, and they may last for a few seconds to a few minutes.  Usually, leg cramps are not caused by a serious medical problem. Often, the cause is not known.  Stay hydrated and take over-the-counter and prescription medicines only as told by your health care provider. This information is not intended to replace advice given to you by your health care provider. Make sure you discuss any questions you have with your health care provider. Document Revised: 05/22/2017 Document Reviewed: 03/19/2017 Elsevier Patient Education  2020 [employer].

## 2019-10-11 NOTE — Addendum Note (Signed)
Addended by: Tobi Bastos on: 10/11/2019 08:48 AM   Modules accepted: Orders

## 2019-10-12 LAB — URINALYSIS, MICROSCOPIC ONLY
Bacteria, UA: NONE SEEN
Casts: NONE SEEN /lpf
RBC, Urine: NONE SEEN /hpf (ref 0–2)
WBC, UA: NONE SEEN /hpf (ref 0–5)

## 2019-10-13 LAB — URINE CULTURE: Organism ID, Bacteria: NO GROWTH

## 2020-09-13 ENCOUNTER — Encounter (HOSPITAL_COMMUNITY): Payer: Self-pay

## 2020-09-13 ENCOUNTER — Other Ambulatory Visit: Payer: Self-pay

## 2020-09-13 ENCOUNTER — Ambulatory Visit (HOSPITAL_COMMUNITY)
Admission: EM | Admit: 2020-09-13 | Discharge: 2020-09-13 | Disposition: A | Discharge status: Home / Self Care | Payer: No Typology Code available for payment source | Attending: Family Medicine | Admitting: Family Medicine

## 2020-09-13 DIAGNOSIS — M898X1 Other specified disorders of bone, shoulder: Secondary | ICD-10-CM

## 2020-09-13 MED ORDER — MELOXICAM 7.5 MG PO TABS: 7.5000 mg | ORAL_TABLET | Freq: Every day | ORAL | 0 refills | Status: DC | PRN

## 2020-09-13 NOTE — ED Provider Notes (Signed)
[facility]    CSN: [id number] Arrival date & time: 09/13/20  3662      History   Chief Complaint Chief Complaint  Patient presents with  . swelling    HPI Karen Thornton is a 50 y.o. female.   HPI  Patient presents with a concern for clavicle swelling and hand pain. She is followed by her primary care doctor related to hand pain which is expected to be related to carpal tunnel. PCP has referred her to hand specialist.  Patient received COVID booster over a week ago and has noticed intermittent swelling and tenderness bilateral clavicle region.  The swelling and pain occurs mostly while working and resolves with rest.  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  She is also request work restrictions that will enable her to perform light duty activities. It is unclear if an open WC is active. Past Medical History:  Diagnosis Date  . 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 GERD (gastroesophageal reflux disease)   . Hypertriglyceridemia   . Low TSH level 07/2016    Patient Active Problem List   Diagnosis Date Noted  . Menorrhagia 11/11/2013  . Enlarged uterus 11/11/2013  . Abnormal mammogram with microcalcification 01/20/2013  . GERD 01/29/2009  . 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 BREAST BIOPSY Right 01/2013    OB History    Gravida  3   Para  2   Term      Preterm      AB  1   Living  2     SAB      IAB      Ectopic      Multiple      Live Births               Home Medications    Prior to Admission medications   Medication Sig Start Date End Date Taking? Authorizing Provider  Fiber POWD Take by mouth.    [provider]  Fish Oil-Cholecalciferol (OMEGA-3 + D PO) Take by mouth.    [provider]  ranitidine (ZANTAC) 150 MG tablet Take 1 tablet (150 mg total) by mouth 2 (two) times daily. 10/02/17    Valarie Cones, Dema Severin, PA-C    Family History Family History  Problem Relation Age of Onset  . Stroke Mother   . Diabetes Mother   . Heart disease Mother   . Hyperlipidemia Mother   . Hypertension Mother   . Cancer Father 77       liver  . Diabetes Brother     Social History Social History   Tobacco Use  . Smoking status: Never Smoker  . Smokeless tobacco: Never Used  Vaping Use  . Vaping Use: Never used  Substance Use Topics  . Alcohol use: No    Alcohol/week: 0.0 standard drinks    Comment: rare social  . Drug use: No     Allergies   Asa [aspirin] and Ibuprofen   Review of Systems Review of Systems Pertinent negatives listed in HPI Physical Exam Triage Vital Signs ED Triage Vitals  Enc Vitals Group     BP 09/13/20 0831 126/74     Pulse Rate 09/13/20 0831 80     Resp 09/13/20 0831 16     Temp 09/13/20 0831 98.6 F (37 C)  Temp Source 09/13/20 0831 Oral     SpO2 09/13/20 0831 96 %     Weight --      Height --      Head Circumference --      Peak Flow --      Pain Score 09/13/20 0829 0     Pain Loc --      Pain Edu? --      Excl. in GC? --    No data found.  Updated Vital Signs BP 126/74 (BP Location: Left Arm)   Pulse 80   Temp 98.6 F (37 C) (Oral)   Resp 16   LMP  (Within Weeks) Comment: 3 weeks  SpO2 96%   Visual Acuity Right Eye Distance:   Left Eye Distance:   Bilateral Distance:    Right Eye Near:   Left Eye Near:    Bilateral Near:     Physical Exam General appearance: Alert, well developed, well nourished, cooperative and in no distress Head: Normocephalic, without obvious abnormality, atraumatic Neck: Full range of motion, normal alignment, negative for neck rigidity, negative cervical spine tenderness.  Clavicles are aligned without any protrusion or tenderness to palpation, negative for adenopathy Respiratory: Respirations even and unlabored, normal respiratory rate Heart: Rate and rhythm normal. No gallop or murmurs noted on  exam  Skin: Skin color, texture, turgor normal. No rashes seen  Psych: Appropriate mood and affect. Neurologic: GCS 15, normal coordination, normal gait  UC Treatments / Results  Labs (all labs ordered are listed, but only abnormal results are displayed) Labs Reviewed - No data to display  EKG   Radiology No results found.  Procedures Procedures (including critical care time)  Medications Ordered in UC Medications - No data to display  Initial Impression / Assessment and Plan / UC Course  I have reviewed the triage vital signs and the nursing notes.  Pertinent labs & imaging results that were available during my care of the patient were reviewed by me and considered in my medical decision making (see chart for details).    Patient presents today with a complaint of intermittent pain to the left and right clavicle.  On exam today no abnormality or deformity or acute pain present.  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  Recommend as needed meloxicam daily as needed.  Patient also requested work restrictions advised that this would need to be worked up in the setting of the occupational medicine site provided information to follow-up at [facility] health.  Patient verbalized understanding and agreed with plan.  Final Clinical Impressions(s) / UC Diagnoses   Final diagnoses:  Pain of left clavicle  Pain of right clavicle     Discharge Instructions     Contact [facility] for evaluation of fitness for work evaluation or work restriction evaluation: [employer] [facility] at [facility]  Address: [address], [place], [place] [address] Hours:  8:00 am-5 pm Phone: [phone]     ED Prescriptions    Medication Sig Dispense Auth. Provider   meloxicam (MOBIC) 7.5 MG tablet Take 1 tablet (7.5 mg total) by mouth daily as needed for pain (Take with food). 20 tablet Bing Neighbors, FNP     PDMP not reviewed this encounter.   Bing Neighbors, FNP 09/13/20 [id number]

## 2020-09-13 NOTE — ED Triage Notes (Signed)
Pt reports swelling around the clavicles x 1 week.States started after having the COVID booster.

## 2020-09-13 NOTE — Discharge Instructions (Signed)
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

## 2020-11-07 ENCOUNTER — Ambulatory Visit: Payer: 59 | Admitting: Obstetrics and Gynecology

## 2021-01-17 ENCOUNTER — Ambulatory Visit: Payer: 59 | Admitting: Obstetrics and Gynecology

## 2021-02-20 NOTE — Progress Notes (Signed)
50 y.o. [id number] Divorced Asian female here for annual exam.    Regular menses.   Same partner.  No change.   Received her Covid booster #1.  PCP: [facility]   Patient's last menstrual period was 02/01/2021 (exact date).     Period Cycle (Days): 30 Period Duration (Days): 5 Period Pattern: Regular Menstrual Flow: Heavy (heavy first 2 days then tapers) Menstrual Control: Maxi pad Menstrual Control Change Freq (Hours): changes maxi pad every 2-3 hours on heaviest day. wears 2 maxis at night Dysmenorrhea: None     Sexually active: Yes.    The current method of family planning is none.    Exercising: Yes.     Treadmill daily Smoker:  no  Health Maintenance: Pap:  12-30-17 Neg:Neg HR HPV, 01-24-15 Neg:Neg HR HPV History of abnormal Pap:  no MMG:  03-21-15 Neg/BiRads1 Colonoscopy: Has appt. this month to schedule BMD:   n/a  Result  n/a TDaP: 11-30-12 Gardasil:   no HIV: 08-06-16 NR Hep C: 08-06-16 Neg Screening Labs:  PCP 2 weeks ago.    reports that she has never smoked. She has never used smokeless tobacco. She reports that she does not drink alcohol and does not use drugs.  Past Medical History:  Diagnosis Date   Calcification of right breast 01/2013   Benign    Carpal tunnel syndrome    GERD (gastroesophageal reflux disease)    Hypertriglyceridemia    Low TSH level 07/2016    Past Surgical History:  Procedure Laterality Date   BREAST BIOPSY Right 01/2013    Current Outpatient Medications  Medication Sig Dispense Refill   Fish Oil-Cholecalciferol (OMEGA-3 + D PO) Take by mouth.     Multiple Vitamin (MULTIVITAMIN) capsule Take 1 capsule by mouth daily.     pyridOXINE (VITAMIN B-6) 100 MG tablet Take 100 mg by mouth daily.     vitamin B-12 (CYANOCOBALAMIN) 1000 MCG tablet Take 1,000 mcg by mouth daily.     No current facility-administered medications for this visit.    Family History  Problem Relation Age of Onset   Stroke Mother    Diabetes Mother     Heart disease Mother    Hyperlipidemia Mother    Hypertension Mother    Cancer Father 29       liver   Diabetes Brother     Review of Systems  All other systems reviewed and are negative.  Exam:   BP 100/64   Pulse 70   Ht 5\' 5"  (1.651 m)   Wt 148 lb (67.1 kg)   LMP 02/01/2021 (Exact Date)   SpO2 100%   BMI 24.63 kg/m     General appearance: alert, cooperative and appears stated age Head: normocephalic, without obvious abnormality, atraumatic Neck: no adenopathy, supple, symmetrical, trachea midline and thyroid normal to inspection and palpation Lungs: clear to auscultation bilaterally Breasts: normal appearance, no masses or tenderness, No nipple retraction or dimpling, No nipple discharge or bleeding, No axillary adenopathy Heart: regular rate and rhythm Abdomen: soft, non-tender; no masses, no organomegaly Extremities: extremities normal, atraumatic, no cyanosis or edema Skin: skin color, texture, turgor normal. No rashes or lesions Lymph nodes: cervical, supraclavicular, and axillary nodes normal. Neurologic: grossly normal  Pelvic: External genitalia:  no lesions              No abnormal inguinal nodes palpated.              Urethra:  normal appearing urethra with no masses,  tenderness or lesions              Bartholins and Skenes: normal                 Vagina: normal appearing vagina with normal color and discharge, no lesions              Cervix: no lesions              Pap taken: no. Bimanual Exam:  Uterus:  normal size, contour, position, consistency, mobility, non-tender              Adnexa: no mass, fullness, tenderness              Rectal exam: yes.  Confirms.              Anus:  normal sphincter tone, no lesions  Chaperone was present for exam:  Marchelle Folks, CMA.  Assessment:   Well woman visit with gynecologic exam.  Plan: Mammogram screening discussed.  She will call to schedule her mammogram. Self breast awareness reviewed. Pap and HR HPV as  above. Guidelines for Calcium, Vitamin D, regular exercise program including cardiovascular and weight bearing exercise. Will get labs from her PCP.  Follow up annually and prn.     After visit summary provided.

## 2021-02-21 ENCOUNTER — Ambulatory Visit (INDEPENDENT_AMBULATORY_CARE_PROVIDER_SITE_OTHER): Payer: No Typology Code available for payment source | Admitting: Obstetrics and Gynecology

## 2021-02-21 ENCOUNTER — Other Ambulatory Visit: Payer: Self-pay

## 2021-02-21 ENCOUNTER — Encounter: Payer: Self-pay | Admitting: Obstetrics and Gynecology

## 2021-02-21 VITALS — BP 100/64 | HR 70 | Ht 65.0 in | Wt 148.0 lb

## 2021-02-21 DIAGNOSIS — Z01419 Encounter for gynecological examination (general) (routine) without abnormal findings: Secondary | ICD-10-CM

## 2021-02-21 NOTE — Patient Instructions (Signed)

## 2022-02-21 ENCOUNTER — Other Ambulatory Visit: Payer: Self-pay | Admitting: Family Medicine

## 2022-02-21 DIAGNOSIS — Z1231 Encounter for screening mammogram for malignant neoplasm of breast: Secondary | ICD-10-CM

## 2022-02-26 NOTE — Progress Notes (Signed)
51 y.o. [id number] Divorced Asian female here for annual exam.    Wants testing for hepatitis B.  Daughter is doing testing for hepatitis B.   Same partner.  Declines STD testing.   Menses about every 3 weeks.   Will be loosing her job and insurance.   PCP:  Karen Beams, MD  Patient's last menstrual period was 02/10/2022 (approximate).     Period Duration (Days): 4-5 days Period Pattern: (!) Irregular (getting closer together) Menstrual Flow:  (light first day then moderate 2 and 3rd day) Menstrual Control: Maxi pad Dysmenorrhea: None     Sexually active: Yes.    The current method of family planning is none.    Exercising: Yes.     Stretches, walking Smoker:  no  Health Maintenance: Pap:  12-30-17 Neg:Neg HR HPV, 01-24-15 Neg:Neg HR HPV History of abnormal Pap:  no MMG:  12-03-16 Neg/BiRads1--info to patient to schedule Colonoscopy: 06/2021 normal w/[facility];10 years BMD:   n/a  Result  n/a TDaP:  11-30-12 Gardasil:   no HIV: 08-06-16 NR Hep C: 08-06-16 Neg Screening Labs:  PCP   reports that she has never smoked. She has never used smokeless tobacco. She reports that she does not drink alcohol and does not use drugs.  Past Medical History:  Diagnosis Date   Calcification of right breast 01/2013   Benign    Carpal tunnel syndrome    GERD (gastroesophageal reflux disease)    Hypertriglyceridemia    Low TSH level 07/2016    Past Surgical History:  Procedure Laterality Date   BREAST BIOPSY Right 01/2013    Current Outpatient Medications  Medication Sig Dispense Refill   Fish Oil-Cholecalciferol (OMEGA-3 + D PO) Take by mouth.     Multiple Vitamin (MULTIVITAMIN) capsule Take 1 capsule by mouth daily.     No current facility-administered medications for this visit.    Family History  Problem Relation Age of Onset   Stroke Mother    Diabetes Mother    Heart disease Mother    Hyperlipidemia Mother    Hypertension Mother    Cancer Father 10       liver    Diabetes Brother     Review of Systems  All other systems reviewed and are negative.   Exam:   BP 100/60   Pulse 88   Ht 5' 4.5" (1.638 m)   Wt 146 lb (66.2 kg)   LMP 02/10/2022 (Approximate)   SpO2 98%   BMI 24.67 kg/m     General appearance: alert, cooperative and appears stated age Head: normocephalic, without obvious abnormality, atraumatic Neck: no adenopathy, supple, symmetrical, trachea midline and thyroid normal to inspection and palpation Lungs: clear to auscultation bilaterally Breasts: normal appearance, no masses or tenderness, No nipple retraction or dimpling, No nipple discharge or bleeding, No axillary adenopathy Heart: regular rate and rhythm Abdomen: soft, non-tender; no masses, no organomegaly Extremities: extremities normal, atraumatic, no cyanosis or edema Skin: skin color, texture, turgor normal. No rashes.  5 mm raised black nevus of the right breast.  (Patient states no change.) Lymph nodes: cervical, supraclavicular, and axillary nodes normal. Neurologic: grossly normal  Pelvic: External genitalia:  no lesions              No abnormal inguinal nodes palpated.              Urethra:  normal appearing urethra with no masses, tenderness or lesions  Bartholins and Skenes: normal                 Vagina: normal appearing vagina with normal color and discharge, no lesions              Cervix: no lesions              Pap taken: yes Bimanual Exam:  Uterus:  normal size, contour, position, consistency, mobility, non-tender              Adnexa: no mass, fullness, tenderness              Rectal exam: yes.  Confirms.              Anus:  normal sphincter tone, no lesions  Chaperone was present for exam:  Karen Thornton, Karen Thornton  Assessment:   Well woman visit with gynecologic exam. Family member with possible hepatitis B. Raised pigmented lesion.   Plan: Mammogram screening discussed.  She will schedule.  Self breast awareness reviewed. Pap and HR HPV  collected. Guidelines for Calcium, Vitamin D, regular exercise program including cardiovascular and weight bearing exercise. HBSAg.   She will make an appointment at Dr. Dorita Sciara office for a skin check.  She is an established patient there.  Follow up annually and prn.   After visit summary provided.

## 2022-02-27 ENCOUNTER — Other Ambulatory Visit (HOSPITAL_COMMUNITY)
Admission: RE | Admit: 2022-02-27 | Discharge: 2022-02-27 | Disposition: A | Discharge status: Home / Self Care | Payer: No Typology Code available for payment source | Source: Ambulatory Visit | Attending: Obstetrics and Gynecology | Admitting: Obstetrics and Gynecology

## 2022-02-27 ENCOUNTER — Ambulatory Visit (INDEPENDENT_AMBULATORY_CARE_PROVIDER_SITE_OTHER): Payer: No Typology Code available for payment source | Admitting: Obstetrics and Gynecology

## 2022-02-27 ENCOUNTER — Other Ambulatory Visit: Payer: Self-pay | Admitting: Family Medicine

## 2022-02-27 ENCOUNTER — Encounter: Payer: Self-pay | Admitting: Obstetrics and Gynecology

## 2022-02-27 VITALS — BP 100/60 | HR 88 | Ht 64.5 in | Wt 146.0 lb

## 2022-02-27 DIAGNOSIS — Z01419 Encounter for gynecological examination (general) (routine) without abnormal findings: Secondary | ICD-10-CM

## 2022-02-27 DIAGNOSIS — Z124 Encounter for screening for malignant neoplasm of cervix: Secondary | ICD-10-CM | POA: Insufficient documentation

## 2022-02-27 DIAGNOSIS — Z1159 Encounter for screening for other viral diseases: Secondary | ICD-10-CM

## 2022-02-27 DIAGNOSIS — Z1231 Encounter for screening mammogram for malignant neoplasm of breast: Secondary | ICD-10-CM

## 2022-02-27 NOTE — Patient Instructions (Signed)

## 2022-02-28 ENCOUNTER — Ambulatory Visit
Admission: RE | Admit: 2022-02-28 | Discharge: 2022-02-28 | Disposition: A | Discharge status: Home / Self Care | Payer: No Typology Code available for payment source | Source: Ambulatory Visit | Attending: Family Medicine | Admitting: Family Medicine

## 2022-02-28 DIAGNOSIS — Z1231 Encounter for screening mammogram for malignant neoplasm of breast: Secondary | ICD-10-CM

## 2022-02-28 LAB — HEPATITIS B SURFACE ANTIGEN: Hepatitis B Surface Ag: NONREACTIVE

## 2022-03-05 LAB — CYTOLOGY - PAP
Comment: NEGATIVE
Diagnosis: NEGATIVE
High risk HPV: NEGATIVE

## 2023-02-18 NOTE — Progress Notes (Deleted)
52 y.o. [id number] Divorced Asian female here for annual exam.    PCP:     No LMP recorded.           Sexually active: {yes no:314532}  The current method of family planning is none.    Exercising: {yes no:314532}  {types:19826} Smoker:  no  Health Maintenance: Pap:  02/27/22 neg: HR HPV neg, 12-30-17 Neg:Neg HR HPV, 01-24-15 Neg:Neg HR HPV  History of abnormal Pap:  no MMG:  02/28/22 Breast Density Cat d, BI-RADS CAT 1 neg Colonoscopy:  06/2021 BMD:   n/a  Result  n/a TDaP:  11/30/12 Gardasil:   no HIV: 08/06/16 NR Hep C: 08/06/16 neg Screening Labs:  Hb today: ***, Urine today: ***   reports that she has never smoked. She has never used smokeless tobacco. She reports that she does not drink alcohol and does not use drugs.  Past Medical History:  Diagnosis Date   Calcification of right breast 01/2013   Benign    Carpal tunnel syndrome    GERD (gastroesophageal reflux disease)    Hypertriglyceridemia    Low TSH level 07/2016    Past Surgical History:  Procedure Laterality Date   BREAST BIOPSY Right 01/2013    Current Outpatient Medications  Medication Sig Dispense Refill   Fish Oil-Cholecalciferol (OMEGA-3 + D PO) Take by mouth.     Multiple Vitamin (MULTIVITAMIN) capsule Take 1 capsule by mouth daily.     No current facility-administered medications for this visit.    Family History  Problem Relation Age of Onset   Stroke Mother    Diabetes Mother    Heart disease Mother    Hyperlipidemia Mother    Hypertension Mother    Cancer Father 86       liver   Diabetes Brother     Review of Systems  Exam:   There were no vitals taken for this visit.    General appearance: alert, cooperative and appears stated age Head: normocephalic, without obvious abnormality, atraumatic Neck: no adenopathy, supple, symmetrical, trachea midline and thyroid normal to inspection and palpation Lungs: clear to auscultation bilaterally Breasts: normal appearance, no masses or tenderness, No  nipple retraction or dimpling, No nipple discharge or bleeding, No axillary adenopathy Heart: regular rate and rhythm Abdomen: soft, non-tender; no masses, no organomegaly Extremities: extremities normal, atraumatic, no cyanosis or edema Skin: skin color, texture, turgor normal. No rashes or lesions Lymph nodes: cervical, supraclavicular, and axillary nodes normal. Neurologic: grossly normal  Pelvic: External genitalia:  no lesions              No abnormal inguinal nodes palpated.              Urethra:  normal appearing urethra with no masses, tenderness or lesions              Bartholins and Skenes: normal                 Vagina: normal appearing vagina with normal color and discharge, no lesions              Cervix: no lesions              Pap taken: {yes no:314532} Bimanual Exam:  Uterus:  normal size, contour, position, consistency, mobility, non-tender              Adnexa: no mass, fullness, tenderness              Rectal exam: {yes no:314532}.  Confirms.              Anus:  normal sphincter tone, no lesions  Chaperone was present for exam:  ***  Assessment:   Well woman visit with gynecologic exam.   Plan: Mammogram screening discussed. Self breast awareness reviewed. Pap and HR HPV as above. Guidelines for Calcium, Vitamin D, regular exercise program including cardiovascular and weight bearing exercise.   Follow up annually and prn.   Additional counseling given.  {yes [id number]. _______ minutes face to face time of which over 50% was spent in counseling.    After visit summary provided.

## 2023-03-04 ENCOUNTER — Ambulatory Visit: Payer: No Typology Code available for payment source | Admitting: Obstetrics and Gynecology

## 2024-02-15 ENCOUNTER — Ambulatory Visit: Admitting: Obstetrics and Gynecology

## 2024-02-15 ENCOUNTER — Encounter: Payer: Self-pay | Admitting: Obstetrics and Gynecology

## 2024-02-15 VITALS — BP 116/74 | HR 81

## 2024-02-15 DIAGNOSIS — D219 Benign neoplasm of connective and other soft tissue, unspecified: Secondary | ICD-10-CM | POA: Diagnosis not present

## 2024-02-15 NOTE — Progress Notes (Signed)
 GYNECOLOGY  VISIT   HPI: 53 y.o.   Divorced  Asian female   [phone] with Patient's last menstrual period was 02/12/2024 (exact date).   here for: Possible tampon stuck. Went to emergency department for evaluation, and no tampon was noted.    July had light menstruation.   Having nights sweats.   Current period started 02/12/24. She changes overnight pad every 3 - 4 hours.   Has been seen at [facility].  Dx of fibroids.   Last pelvic US  on 02/03/24, she had period 2 days prior to the US .   Diagnosis in review of Epic were fibroids, adnexal mass, and thickened endometrium.  Report not available in Epic.    GYNECOLOGIC HISTORY: Patient's last menstrual period was 02/12/2024 (exact date). Contraception:  none Menopausal hormone therapy:  n/a Last 2 paps:  04/10/23 - neg HR HPV 16/18.  02/27/22 neg HR HPV neg, 12/30/17 neg HPV neg History of abnormal Pap or positive HPV:  no Mammogram:  02/28/22 Breast Density Cat D, BIRADS Cat 1 neg         OB History     Gravida  3   Para  2   Term      Preterm      AB  1   Living  2      SAB      IAB      Ectopic      Multiple      Live Births                 Patient Active Problem List   Diagnosis Date Noted   Menorrhagia 11/11/2013   Enlarged uterus 11/11/2013   Abnormal mammogram with microcalcification 01/20/2013   GERD 01/29/2009   POSITIVE PPD 02/12/2007    Past Medical History:  Diagnosis Date   Calcification of right breast 01/2013   Benign    Carpal tunnel syndrome    Fibroid    GERD (gastroesophageal reflux disease)    Hypertriglyceridemia    Low TSH level 07/2016    Past Surgical History:  Procedure Laterality Date   BREAST BIOPSY Right 01/2013    Current Outpatient Medications  Medication Sig Dispense Refill   Cholecalciferol 125 MCG (5000 UT) capsule Take 5,000 Units by mouth.     Cyanocobalamin (VITAMIN B12 PO) Take by mouth.     Fish Oil-Cholecalciferol (OMEGA-3 + D PO) Take by mouth.      MAGNESIUM GLYCINATE PO Take by mouth.     Multiple Vitamin (MULTIVITAMIN) capsule Take 1 capsule by mouth daily.     No current facility-administered medications for this visit.     ALLERGIES: Asa [aspirin], Ibuprofen, and Prednisone  Family History  Problem Relation Age of Onset   Stroke Mother    Diabetes Mother    Heart disease Mother    Hyperlipidemia Mother    Hypertension Mother    Cancer Father 23       liver   Diabetes Brother     Social History   Socioeconomic History   Marital status: Divorced    Spouse name: Not on file   Number of children: 3   Years of education: Not on file   Highest education level: Not on file  Occupational History   Occupation: Advertising account planner  Tobacco Use   Smoking status: Never   Smokeless tobacco: Never  Vaping Use   Vaping status: Never Used  Substance and Sexual Activity   Alcohol use: No  Alcohol/week: 0.0 standard drinks of alcohol    Comment: rare social   Drug use: No   Sexual activity: Yes    Partners: Male    Birth control/protection: None  Other Topics Concern   Not on file  Social History Narrative   Lives with boyfriend   Children in college   From [place] - moved to [place]  in [place]   Social Drivers of Health   Financial Resource Strain: Not on file  Food Insecurity: Low Risk  (09/22/2023)   Received from [facility]   Hunger Vital Sign    Within the past 12 months, you worried that your food would run out before you got money to buy more: Never true    Within the past 12 months, the food you bought just didn't last and you didn't have money to get more. : Never true  Transportation Needs: No Transportation Needs (09/22/2023)   Received from [facility]    In the past 12 months, has lack of reliable transportation kept you from medical appointments, meetings, work or from getting things needed for daily living? : No  Physical Activity: Not on file  Stress: Not on file  Social Connections:  Not on file  Intimate Partner Violence: Not on file    Review of Systems  All other systems reviewed and are negative.   PHYSICAL EXAMINATION:   BP 116/74 (BP Location: Left Arm, Patient Position: Sitting)   Pulse 81   LMP 02/12/2024 (Exact Date)   SpO2 95%     General appearance: alert, cooperative and appears stated age   Pelvic: External genitalia:  no lesions              Urethra:  normal appearing urethra with no masses, tenderness or lesions              Bartholins and Skenes: normal                 Vagina: normal appearing vagina with normal color and discharge, no lesions              Cervix: no lesions.  Menstrual flow noted.  No retained tampon.                Bimanual Exam:  Uterus:  fullness on right side of uterus.               Adnexa: no mass, fullness, tenderness   Chaperone was present for exam:  Kari HERO, CMA  ASSESSMENT:  Fibroids.  No retained tampon.   PLAN:  Will get US  report from [facility].  Return in 3 months for a recheck and annual exam.   20 min  total time was spent for this patient encounter, including preparation, face-to-face counseling with the patient, coordination of care, and documentation of the encounter.

## 2024-02-17 ENCOUNTER — Telehealth: Payer: Self-pay | Admitting: Obstetrics and Gynecology

## 2024-02-17 DIAGNOSIS — R9389 Abnormal findings on diagnostic imaging of other specified body structures: Secondary | ICD-10-CM

## 2024-02-17 DIAGNOSIS — N9489 Other specified conditions associated with female genital organs and menstrual cycle: Secondary | ICD-10-CM

## 2024-02-17 DIAGNOSIS — N83202 Unspecified ovarian cyst, left side: Secondary | ICD-10-CM

## 2024-02-17 DIAGNOSIS — D219 Benign neoplasm of connective and other soft tissue, unspecified: Secondary | ICD-10-CM

## 2024-02-17 NOTE — Telephone Encounter (Signed)
 I received the copy of the patient's ultrasound report from [facility] from 02/03/24.   Her ultrasound showed fibroids, thickened endometrium, a possible mass of the right adnexa which may be a pedunculated fibroid, and a complex cyst of her left ovary.    Instead of her recheck with me in November, I would recommend a pelvic ultrasound and office visit with me in October if possible.

## 2024-02-18 NOTE — Telephone Encounter (Signed)
 Left message to call back.

## 2024-02-18 NOTE — Telephone Encounter (Signed)
 U/s & consult with silva is 04-06-24. November appt is cancelled.

## 2024-02-18 NOTE — Telephone Encounter (Signed)
 Patient notified & agrees to schedule u/s & consult. Message sent to scheduling department to cancel the November appt & schedule the u/s with consult. Dr Nikki placed the u/s order.

## 2024-02-29 ENCOUNTER — Ambulatory Visit: Payer: Self-pay | Admitting: Obstetrics and Gynecology

## 2024-03-28 NOTE — Telephone Encounter (Signed)
 Called pt and stated she is going to a different doctor now

## 2024-03-29 NOTE — Telephone Encounter (Signed)
 As long as patient is actively under the care of a different ObGyn that is okay. Thanks for reaching out.

## 2024-03-30 ENCOUNTER — Ambulatory Visit: Payer: Self-pay | Admitting: Obstetrics and Gynecology

## 2024-04-05 NOTE — Progress Notes (Unsigned)
 GYNECOLOGY  VISIT   HPI: 53 y.o.   Divorced  Asian female   [phone] with Patient's last menstrual period was 03/26/2024 (exact date).   here for: U/S Consult for fibroids, right adnexal mass (possible pedunculated fibroid), complex left ovarian cyst. and thickened endometrium noted on US  at outside office 02/03/24.  Report will be scanned in to Epic.    Menses 03/09/24 - 03/14/24.  Had spotting after this that would come and go.  Menses 03/26/24 - 03/29/24.   No bleeding since then. Pad change 2 - 3 times during the day with her cycles.   Had an abnormal period that occurred once last year.    No pain with periods.    GYNECOLOGIC HISTORY: Patient's last menstrual period was 03/26/2024 (exact date). Contraception:  none Menopausal hormone therapy:  n/a Last 2 paps:  02/27/22 neg HR HPV neg, 12/30/17 neg HPV neg History of abnormal Pap or positive HPV:  no Mammogram:  08/18/23 BIRADS Cat 1 neg (Care everywhere)        OB History     Gravida  3   Para  2   Term      Preterm      AB  1   Living  2      SAB      IAB      Ectopic      Multiple      Live Births                 Patient Active Problem List   Diagnosis Date Noted   Menorrhagia 11/11/2013   Enlarged uterus 11/11/2013   Abnormal mammogram with microcalcification 01/20/2013   GERD 01/29/2009   POSITIVE PPD 02/12/2007    Past Medical History:  Diagnosis Date   Calcification of right breast 01/2013   Benign    Carpal tunnel syndrome    Fibroid    GERD (gastroesophageal reflux disease)    Hypertriglyceridemia    Low TSH level 07/2016    Past Surgical History:  Procedure Laterality Date   BREAST BIOPSY Right 01/2013    Current Outpatient Medications  Medication Sig Dispense Refill   Cholecalciferol 125 MCG (5000 UT) capsule Take 5,000 Units by mouth.     Cyanocobalamin (VITAMIN B12 PO) Take by mouth.     Ferrous Sulfate (IRON PO) Take by mouth.     Fish Oil-Cholecalciferol (OMEGA-3 + D PO)  Take by mouth.     Multiple Vitamin (MULTIVITAMIN) capsule Take 1 capsule by mouth daily.     MAGNESIUM GLYCINATE PO Take by mouth. (Patient not taking: Reported on 04/06/2024)     No current facility-administered medications for this visit.     ALLERGIES: Asa [aspirin], Ibuprofen, and Prednisone  Family History  Problem Relation Age of Onset   Stroke Mother    Diabetes Mother    Heart disease Mother    Hyperlipidemia Mother    Hypertension Mother    Cancer Father 68       liver   Diabetes Brother     Social History   Socioeconomic History   Marital status: Divorced    Spouse name: Not on file   Number of children: 3   Years of education: Not on file   Highest education level: Not on file  Occupational History   Occupation: Advertising account planner  Tobacco Use   Smoking status: Never   Smokeless tobacco: Never  Vaping Use   Vaping status: Never Used  Substance and  Sexual Activity   Alcohol use: No    Alcohol/week: 0.0 standard drinks of alcohol    Comment: rare social   Drug use: No   Sexual activity: Yes    Partners: Male    Birth control/protection: None  Other Topics Concern   Not on file  Social History Narrative   Lives with boyfriend   Children in college   From [place] - [place]   Social Drivers of Health   Financial Resource Strain: Not on file  Food Insecurity: Low Risk  (09/22/2023)   Received from [facility]   Hunger Vital Sign    Within the past 12 months, you worried that your food would run out before you got money to buy more: Never true    Within the past 12 months, the food you bought just didn't last and you didn't have money to get more. : Never true  Transportation Needs: No Transportation Needs (09/22/2023)   Received from [facility]    In the past 12 months, has lack of reliable transportation kept you from medical appointments, meetings, work or from getting things needed for daily living? : No  Physical  Activity: Not on file  Stress: Not on file  Social Connections: Not on file  Intimate Partner Violence: Not on file    Review of Systems  All other systems reviewed and are negative.   PHYSICAL EXAMINATION:   BP 116/82 (BP Location: Left Arm, Patient Position: Sitting)   Pulse 73   LMP 03/26/2024 (Exact Date)   SpO2 97%     General appearance: alert, cooperative and appears stated age   Pelvic US : Uterus 11.31 x 6.75 x 6.94 cm.  Some inhomogeneous change of the myometrium.  Fibroids - intramural and pedunculated: 1.62 cm, 0.97 cm, 0.95 cm, 2.41 cm. The largest fibroid is pedunculated and adjacent to the right ovary.  EMS 9.48 mm. Symmetrical, no masses.          Left ovary 3.23 x 1.73 x 2.12 cm.  Right ovary 2.55 x 1.73 x 1.53 cm.  No adnexal masses.  No free fluid.   ASSESSMENT:  Uterine fibroids. Intramural and pedunculated.  Normal endometrium.   Normal ovaries.   Recent irregular menstrual cycle.  This may be perimenopause.   PLAN:  Pelvic US  images and report reviewed.  Fibroids discussed, their benign nature, and potential symptoms of heavy and possible irregular bleeding.   No treatment for fibroids at this time.  Keep a bleeding calendar.   Return for prolonged, irregular, or heavy menstrual periods.  Follow up for annual exam and prn.   25 min  total time was spent for this patient encounter, including preparation, face-to-face counseling with the patient, coordination of care, and documentation of the encounter.

## 2024-04-06 ENCOUNTER — Ambulatory Visit (INDEPENDENT_AMBULATORY_CARE_PROVIDER_SITE_OTHER): Admitting: Obstetrics and Gynecology

## 2024-04-06 ENCOUNTER — Encounter: Payer: Self-pay | Admitting: Obstetrics and Gynecology

## 2024-04-06 ENCOUNTER — Ambulatory Visit

## 2024-04-06 VITALS — BP 116/82 | HR 73

## 2024-04-06 DIAGNOSIS — N9489 Other specified conditions associated with female genital organs and menstrual cycle: Secondary | ICD-10-CM

## 2024-04-06 DIAGNOSIS — D219 Benign neoplasm of connective and other soft tissue, unspecified: Secondary | ICD-10-CM | POA: Diagnosis not present

## 2024-04-06 DIAGNOSIS — N83202 Unspecified ovarian cyst, left side: Secondary | ICD-10-CM | POA: Diagnosis not present

## 2024-04-06 DIAGNOSIS — R9389 Abnormal findings on diagnostic imaging of other specified body structures: Secondary | ICD-10-CM | POA: Diagnosis not present

## 2024-04-06 NOTE — Patient Instructions (Signed)
 Fibroids in the Uterus: What to Know  Fibroids are growths (tumors) in the uterus. Fibroids are not cancer. Most people with this condition do not need treatment. Sometimes, fibroids can make it hard to get pregnant. If this happens, you may need surgery to take out the fibroids. What are the causes? The cause of this condition is not known. What increases the risk? You're in your 30s or 40s and have not stopped having a period. You have family members who have had fibroids. You're of African American descent. You started your period at age 63 or younger. You've not given birth. You're overweight or you're obese. You eat a diet low in fruits, vegetables, and vitamin D . What are the signs or symptoms? Heavy bleeding during your period. Bleeding between periods. Pain in the area between your hips (pelvis). Pain during sex. Needing to pee right away or peeing more often than usual. Not being able to have children. Not being able to stay pregnant (miscarriage). Many people do not have symptoms. How is this treated? Treatment may include: Medicines to help with pain, such as aspirin or ibuprofen. Hormone treatment. This may be given as a pill, in a shot, or with a type of birth control device called an IUD. Surgery. This may be done to: Take out the fibroids. This may be done if you want to become pregnant. Take out the uterus. Stop the blood flow to the fibroids. Procedures to shrink the fibroids. This may be done with: Heat and radio energy. Ultrasound waves. Follow these instructions at home: Medicines Take your medicines only as told. You can lose a lot of iron because of heavy bleeding from your period. Ask your doctor if you should: Take iron pills. Eat more foods that have iron in them, such as dark green, leafy vegetables. Managing pain  Put heat on your back or belly as told. Use the heat source that your doctor recommends, such as a moist heat pack or a heating pad. Do  this as often as told. Put a towel between your skin and the heat source. Leave the heat on for 20-30 minutes. If your skin turns red, take off the heat right away to prevent burns. The risk of burns is higher if you can't feel pain, heat, or cold. General instructions Tell your doctor about any changes in your period, such as: Heavy bleeding that needs a change of tampons or pads more than normal. A change in how many days your period lasts. A change in symptoms that come with your period. This might be cramps in your belly or pain in your back. Keep all follow-up visits. Your doctor needs to check your fibroids for any changes. Contact a doctor if: You have pain that does not get better with medicine or heat. This may include pain or cramps in: The area between your hip bones. Your back. Your belly. You have new bleeding between your periods. You have more bleeding during or between your periods. You feel very tired or weak. You feel dizzy. Get help right away if: You faint. You have pain in the area between your hip bones that gets worse. You have bleeding that soaks a tampon or pad in 30 minutes or less. This information is not intended to replace advice given to you by your health care provider. Make sure you discuss any questions you have with your health care provider. Document Revised: 12/09/2022 Document Reviewed: 12/09/2022 Elsevier Patient Education  2024 [employer].

## 2024-05-16 ENCOUNTER — Ambulatory Visit: Admitting: Obstetrics and Gynecology

## 2024-09-19 ENCOUNTER — Ambulatory Visit: Admitting: Obstetrics and Gynecology
